# Patient Record
Sex: Female | Born: 1955 | Race: White | Hispanic: No | Marital: Married | Smoking: Never smoker
Health system: Southern US, Community
[De-identification: ages and names within clinical notes are randomized; demographics above are authoritative.]

## PROBLEM LIST (undated history)

## (undated) DIAGNOSIS — Z789 Other specified health status: Secondary | ICD-10-CM

## (undated) DIAGNOSIS — I219 Acute myocardial infarction, unspecified: Secondary | ICD-10-CM

## (undated) DIAGNOSIS — Z0000 Encounter for general adult medical examination without abnormal findings: Secondary | ICD-10-CM

## (undated) DIAGNOSIS — M25569 Pain in unspecified knee: Secondary | ICD-10-CM

## (undated) DIAGNOSIS — J309 Allergic rhinitis, unspecified: Secondary | ICD-10-CM

## (undated) DIAGNOSIS — I10 Essential (primary) hypertension: Secondary | ICD-10-CM

## (undated) DIAGNOSIS — E785 Hyperlipidemia, unspecified: Secondary | ICD-10-CM

## (undated) DIAGNOSIS — M79672 Pain in left foot: Secondary | ICD-10-CM

## (undated) DIAGNOSIS — N6029 Fibroadenosis of unspecified breast: Secondary | ICD-10-CM

## (undated) DIAGNOSIS — D492 Neoplasm of unspecified behavior of bone, soft tissue, and skin: Secondary | ICD-10-CM

## (undated) DIAGNOSIS — T7840XA Allergy, unspecified, initial encounter: Secondary | ICD-10-CM

## (undated) DIAGNOSIS — I259 Chronic ischemic heart disease, unspecified: Secondary | ICD-10-CM

## (undated) DIAGNOSIS — I639 Cerebral infarction, unspecified: Secondary | ICD-10-CM

## (undated) HISTORY — DX: Encounter for general adult medical examination without abnormal findings: Z00.00

## (undated) HISTORY — DX: Neoplasm of unspecified behavior of bone, soft tissue, and skin: D49.2

## (undated) HISTORY — DX: Acute myocardial infarction, unspecified: I21.9

## (undated) HISTORY — DX: Hyperlipidemia, unspecified: E78.5

## (undated) HISTORY — PX: OTHER SURGICAL HISTORY: SHX169

## (undated) HISTORY — DX: Other specified health status: Z78.9

## (undated) HISTORY — DX: Allergy, unspecified, initial encounter: T78.40XA

## (undated) HISTORY — DX: Pain in unspecified knee: M25.569

## (undated) HISTORY — DX: Chronic ischemic heart disease, unspecified: I25.9

## (undated) HISTORY — DX: Allergic rhinitis, unspecified: J30.9

## (undated) HISTORY — DX: Pain in left foot: M79.672

## (undated) HISTORY — DX: Cerebral infarction, unspecified: I63.9

## (undated) HISTORY — DX: Essential (primary) hypertension: I10

## (undated) HISTORY — DX: Fibroadenosis of unspecified breast: N60.29

---

## 2008-09-07 HISTORY — PX: CARDIAC CATHETERIZATION: SHX172

## 2011-02-09 ENCOUNTER — Encounter (INDEPENDENT_AMBULATORY_CARE_PROVIDER_SITE_OTHER): Payer: Self-pay | Admitting: Internal Medicine

## 2011-02-09 ENCOUNTER — Ambulatory Visit (INDEPENDENT_AMBULATORY_CARE_PROVIDER_SITE_OTHER): Payer: Exclusive Provider Organization | Admitting: Internal Medicine

## 2011-02-09 VITALS — BP 122/79 | HR 81 | Temp 98.3°F | Ht 64.0 in | Wt 160.0 lb

## 2011-02-09 DIAGNOSIS — Z0000 Encounter for general adult medical examination without abnormal findings: Secondary | ICD-10-CM | POA: Insufficient documentation

## 2011-02-09 DIAGNOSIS — M79672 Pain in left foot: Secondary | ICD-10-CM | POA: Insufficient documentation

## 2011-02-09 DIAGNOSIS — I219 Acute myocardial infarction, unspecified: Secondary | ICD-10-CM | POA: Insufficient documentation

## 2011-02-09 DIAGNOSIS — E785 Hyperlipidemia, unspecified: Secondary | ICD-10-CM | POA: Insufficient documentation

## 2011-02-09 DIAGNOSIS — T7840XA Allergy, unspecified, initial encounter: Secondary | ICD-10-CM | POA: Insufficient documentation

## 2011-02-09 DIAGNOSIS — I10 Essential (primary) hypertension: Secondary | ICD-10-CM | POA: Insufficient documentation

## 2011-02-09 DIAGNOSIS — M79609 Pain in unspecified limb: Secondary | ICD-10-CM

## 2011-02-09 NOTE — Progress Notes (Signed)
Subjective:       Patient ID: Cynthia Reyes is a 55 y.o. female.    HPI Comments: 55 year old female in to establish care and for b/l achilles pain.  She has increased her exercise and has increased her walking.  She wears tennis shoes and has increased her exercise.  She denies travel a 12 hour flight .  She denies swelling and pain in calves.  She has a hx of MI in 2010.  Pain has been ongoing now for months.  She was working with a Psychologist, educational- she had used shoes.  She states the pain is every day and constant .  She denies it is not affected by heals.           Review of Systems   Constitutional: Negative.    HENT: Negative.    Respiratory: Negative.    Cardiovascular: Negative.    Gastrointestinal: Negative.    Genitourinary: Negative.            Objective:    Physical Exam   Nursing note and vitals reviewed.  HENT:   Head: Normocephalic and atraumatic.   Right Ear: External ear normal.   Left Ear: External ear normal.   Eyes: EOM are normal.   Neck: Normal range of motion. Neck supple.   Cardiovascular: Normal rate, regular rhythm and normal heart sounds.    Pulmonary/Chest: Effort normal and breath sounds normal.   Abdominal: Soft. Bowel sounds are normal.   Musculoskeletal: Normal range of motion.   Skin: Skin is warm and dry.           Assessment and Plan:               Marland Kitchen Hyperlipidemia      On medication   . Allergy      seasonal on Meds   . Hypertension      On Meds   . Myocardial infarction stress test in July 2012     05/23/2009- on meds Cardiologist Dr. Luberta Mutter    . Health maintenance examination      Pap/Mammogram:  July 2011   Labs done: one year ago.  tdap: ? 10 years ago   . Pain in both feet      back of calves- achilles tendon.  NO recent travel refer to podiatry for pain.

## 2011-02-18 ENCOUNTER — Encounter (INDEPENDENT_AMBULATORY_CARE_PROVIDER_SITE_OTHER): Payer: Self-pay | Admitting: Internal Medicine

## 2011-02-18 DIAGNOSIS — N6029 Fibroadenosis of unspecified breast: Secondary | ICD-10-CM | POA: Insufficient documentation

## 2011-02-18 DIAGNOSIS — I259 Chronic ischemic heart disease, unspecified: Secondary | ICD-10-CM | POA: Insufficient documentation

## 2011-02-18 DIAGNOSIS — I639 Cerebral infarction, unspecified: Secondary | ICD-10-CM | POA: Insufficient documentation

## 2011-02-18 DIAGNOSIS — J309 Allergic rhinitis, unspecified: Secondary | ICD-10-CM | POA: Insufficient documentation

## 2011-02-18 DIAGNOSIS — D492 Neoplasm of unspecified behavior of bone, soft tissue, and skin: Secondary | ICD-10-CM | POA: Insufficient documentation

## 2011-02-19 ENCOUNTER — Encounter (INDEPENDENT_AMBULATORY_CARE_PROVIDER_SITE_OTHER): Payer: Self-pay

## 2011-03-05 ENCOUNTER — Encounter (INDEPENDENT_AMBULATORY_CARE_PROVIDER_SITE_OTHER): Payer: Self-pay | Admitting: Internal Medicine

## 2011-03-05 DIAGNOSIS — Z789 Other specified health status: Secondary | ICD-10-CM | POA: Insufficient documentation

## 2012-01-14 ENCOUNTER — Telehealth (INDEPENDENT_AMBULATORY_CARE_PROVIDER_SITE_OTHER): Payer: Self-pay | Admitting: Internal Medicine

## 2012-01-14 NOTE — Telephone Encounter (Signed)
Patient would like refill of flonase generic written to be picked up, she has appt 5/29 and would like a supply to get her until that date, please call for pick up [phone]

## 2012-01-15 ENCOUNTER — Other Ambulatory Visit (INDEPENDENT_AMBULATORY_CARE_PROVIDER_SITE_OTHER): Payer: Self-pay | Admitting: Internal Medicine

## 2012-01-15 ENCOUNTER — Telehealth (INDEPENDENT_AMBULATORY_CARE_PROVIDER_SITE_OTHER): Payer: Self-pay

## 2012-01-15 ENCOUNTER — Encounter (INDEPENDENT_AMBULATORY_CARE_PROVIDER_SITE_OTHER): Payer: Self-pay

## 2012-01-15 DIAGNOSIS — J309 Allergic rhinitis, unspecified: Secondary | ICD-10-CM

## 2012-01-15 MED ORDER — FLUTICASONE PROPIONATE 50 MCG/ACT NA SUSP
2.00 | Freq: Every day | NASAL | Status: DC
Start: 2012-01-15 — End: 2012-02-03

## 2012-01-15 NOTE — Telephone Encounter (Signed)
I have never seen her for allergies. I can give her a one month supply I will put Rx in now. Thanks

## 2012-01-15 NOTE — Progress Notes (Signed)
Called [facility] for flonase #[id number]

## 2012-02-03 ENCOUNTER — Ambulatory Visit (INDEPENDENT_AMBULATORY_CARE_PROVIDER_SITE_OTHER): Payer: Exclusive Provider Organization | Admitting: Internal Medicine

## 2012-02-03 ENCOUNTER — Encounter (INDEPENDENT_AMBULATORY_CARE_PROVIDER_SITE_OTHER): Payer: Self-pay | Admitting: Internal Medicine

## 2012-02-03 VITALS — BP 125/85 | HR 82 | Temp 98.3°F | Ht 64.0 in | Wt 167.0 lb

## 2012-02-03 DIAGNOSIS — Z0000 Encounter for general adult medical examination without abnormal findings: Secondary | ICD-10-CM

## 2012-02-03 DIAGNOSIS — Z23 Encounter for immunization: Secondary | ICD-10-CM

## 2012-02-03 DIAGNOSIS — Q829 Congenital malformation of skin, unspecified: Secondary | ICD-10-CM

## 2012-02-03 DIAGNOSIS — Q849 Congenital malformation of integument, unspecified: Secondary | ICD-10-CM

## 2012-02-03 DIAGNOSIS — J309 Allergic rhinitis, unspecified: Secondary | ICD-10-CM

## 2012-02-03 MED ORDER — FLUTICASONE PROPIONATE 50 MCG/ACT NA SUSP
2.00 | Freq: Every day | NASAL | Status: DC
Start: 2012-02-03 — End: 2013-03-02

## 2012-02-03 NOTE — Progress Notes (Signed)
Subjective:       Patient ID: Cynthia Reyes is a 56 y.o. female.    HPI    56 year old in for cpe  Review of Systems   Constitutional: Negative for diaphoresis, fatigue and unexpected weight change.   HENT: Negative for hearing loss, ear pain, congestion, sore throat, rhinorrhea, sneezing, trouble swallowing, dental problem and sinus pressure.    Eyes: Negative for pain and visual disturbance.   Respiratory: Negative for apnea, cough, chest tightness and shortness of breath.    Cardiovascular: Negative for chest pain, palpitations and leg swelling.   Gastrointestinal: Negative for nausea, vomiting, abdominal pain, diarrhea, constipation and blood in stool.   Genitourinary: Negative for dysuria, urgency, frequency, hematuria, decreased urine volume, vaginal discharge, difficulty urinating, vaginal pain, pelvic pain and dyspareunia.   Musculoskeletal: Negative for myalgias and arthralgias.   Skin: Negative for color change and rash.   Neurological: Negative for dizziness, tremors, weakness and headaches.   Hematological: Negative for adenopathy.   Psychiatric/Behavioral: Negative for suicidal ideas, disturbed wake/sleep cycle, self-injury and agitation.           Objective:    Physical Exam   Nursing note and vitals reviewed.  Constitutional: She is oriented to person, place, and time. She appears well-developed and well-nourished.   HENT:   Head: Normocephalic and atraumatic.   Right Ear: External ear normal.   Left Ear: External ear normal.   Nose: Nose normal.   Mouth/Throat: Oropharynx is clear and moist.   Eyes: EOM are normal. Right eye exhibits no discharge. Left eye exhibits no discharge.   Neck: Normal range of motion. Neck supple. No JVD present. No thyromegaly present.   Cardiovascular: Normal rate, regular rhythm, normal heart sounds and intact distal pulses.    No murmur heard.  Pulmonary/Chest: Effort normal and breath sounds normal.   Abdominal: Soft. Bowel sounds are normal. She exhibits no  distension and no mass. There is no rebound and no guarding.   Musculoskeletal: Normal range of motion. She exhibits no edema and no tenderness.   Lymphadenopathy:     She has no cervical adenopathy.   Neurological: She is oriented to person, place, and time. She has normal reflexes.   Skin: Skin is warm and dry.   Psychiatric: She has a normal mood and affect. Her behavior is normal.           Assessment:       56 year old female in for cpe   Counseling/Anticipatory Guidance:  nutrition, family planning/contraception, physical activity, healthy weight, injury prevention, misuse of tobacco, alcohol and drugs, sexual behavior and STDs, dental health, mental health, immunizations, screenings    Breast cancer and self breast exams

## 2012-02-04 LAB — COMPREHENSIVE METABOLIC PANEL
ALT: 17 U/L (ref 6–40)
AST (SGOT): 18 U/L (ref 10–35)
Albumin/Globulin Ratio: 1.6 (ref 1.0–2.5)
Albumin: 4.4 G/DL (ref 3.6–5.1)
Alkaline Phosphatase: 62 U/L (ref 33–130)
BUN: 18 MG/DL (ref 7–25)
Bilirubin, Total: 0.4 MG/DL (ref 0.2–1.2)
CO2: 26 mmol/L (ref 19–30)
Calcium: 9.9 MG/DL (ref 8.6–10.4)
Chloride: 104 mmol/L (ref 98–110)
Creatinine: 0.77 mg/dL (ref 0.50–1.05)
EGFR African American: 100 mL/min/{1.73_m2} (ref >=60)
EGFR: 86 mL/min/{1.73_m2} (ref >=60)
Globulin: 2.7 G/DL (ref 1.9–3.7)
Glucose: 92 MG/DL (ref 65–99)
Potassium: 4.4 mmol/L (ref 3.5–5.3)
Protein, Total: 7.1 G/DL (ref 6.1–8.1)
Sodium: 142 mmol/L (ref 135–146)

## 2012-02-04 LAB — CBC AND DIFFERENTIAL
Atypical Lymphocytes %: 0 %
Baso(Absolute): 16 cells/uL (ref 0–200)
Basophils: 0.3 %
Eosinophils Absolute: 47 cells/uL (ref 15–500)
Eosinophils: 0.9 %
Hematocrit: 42.3 % (ref 35.0–45.0)
Hemoglobin: 14 g/dL (ref 11.7–15.5)
Lymphocytes Absolute: 1690 cells/uL (ref 850–3900)
Lymphocytes: 32.5 %
MCH: 32.7 pg (ref 27–33)
MCHC: 33 g/dL (ref 32–36)
MCV: 99 fL (ref 80–100)
MPV: 8.6 fL (ref 7.5–11.5)
Monocytes Absolute: 322 cells/uL (ref 200–950)
Monocytes: 6.2 %
Neutrophils Absolute: 3125 cells/uL (ref 1500–7800)
Neutrophils: 60.1 %
Platelets: 274 10*3/uL (ref 140–400)
RBC: 4.27 10*6/uL (ref 3.80–5.10)
RDW: 14.9 % (ref 11.0–15.0)
WBC: 5.2 10*3/uL (ref 3.8–10.8)

## 2012-02-04 LAB — URINALYSIS
Bilirubin, UA: NEGATIVE
Blood, UA: NEGATIVE
Glucose Qualitative: NEGATIVE
Ketones UA: NEGATIVE
Leukocyte Esterase, UA: NEGATIVE
NITRITE: NEGATIVE
Protein, UA: NEGATIVE
Specific Gravity, UA: 1.024 (ref 1.001–1.035)
pH: 6 (ref 5.0–8.0)

## 2012-02-04 LAB — LIPID PANEL
Cholesterol / HDL Ratio: 2.8 (ref 0.0–5.0)
Cholesterol: 211 MG/DL — ABNORMAL HIGH (ref 125–200)
HDL: 75 MG/DL (ref >=46)
LDL Calculated: 93 MG/DL (ref <130)
Non HDL Cholesterol (LDL and VLDL): 136 mg/dL
Triglycerides: 215 MG/DL — ABNORMAL HIGH (ref <150)

## 2012-02-04 LAB — TSH: TSH: 1.32 mIU/L (ref 0.40–4.50)

## 2012-04-13 ENCOUNTER — Other Ambulatory Visit (INDEPENDENT_AMBULATORY_CARE_PROVIDER_SITE_OTHER): Payer: Self-pay | Admitting: Internal Medicine

## 2012-04-13 DIAGNOSIS — E785 Hyperlipidemia, unspecified: Secondary | ICD-10-CM

## 2012-04-19 ENCOUNTER — Ambulatory Visit (INDEPENDENT_AMBULATORY_CARE_PROVIDER_SITE_OTHER): Payer: Exclusive Provider Organization

## 2012-04-20 LAB — LIPID PANEL
Cholesterol / HDL Ratio: 2.5 (ref 0.0–5.0)
Cholesterol: 130 MG/DL (ref 125–200)
HDL: 53 MG/DL (ref >=46)
LDL Calculated: 59 MG/DL (ref <130)
Non HDL Cholesterol (LDL and VLDL): 77 mg/dL
Triglycerides: 88 MG/DL (ref <150)

## 2012-07-06 ENCOUNTER — Telehealth (INDEPENDENT_AMBULATORY_CARE_PROVIDER_SITE_OTHER): Payer: Self-pay | Admitting: Internal Medicine

## 2012-07-06 NOTE — Telephone Encounter (Signed)
[facility] is trying to process a claim for this patient's visit on 02/16/12. The general exam CPT is not enough. They need the CPT code for each test. I can fax this information to them at [phone] [facility]. Thanks!

## 2012-07-06 NOTE — Telephone Encounter (Signed)
The only CPT code would be v70.0 for annual physical. That is the only code I have and have used.

## 2012-08-11 ENCOUNTER — Other Ambulatory Visit (INDEPENDENT_AMBULATORY_CARE_PROVIDER_SITE_OTHER): Payer: Self-pay | Admitting: Internal Medicine

## 2012-08-11 NOTE — Telephone Encounter (Signed)
Pt needs a 90 day supply of lisinopril 5 mg for [facility].  Pt phone number [phone]        Thank you,      Shemel

## 2012-08-12 MED ORDER — LISINOPRIL 5 MG PO TABS
5.00 mg | ORAL_TABLET | Freq: Every day | ORAL | Status: DC
Start: 2012-08-11 — End: 2012-11-15

## 2012-08-12 NOTE — Telephone Encounter (Signed)
PT needs an apt

## 2012-10-24 ENCOUNTER — Ambulatory Visit (INDEPENDENT_AMBULATORY_CARE_PROVIDER_SITE_OTHER): Payer: Exclusive Provider Organization | Admitting: Internal Medicine

## 2012-10-24 ENCOUNTER — Encounter (INDEPENDENT_AMBULATORY_CARE_PROVIDER_SITE_OTHER): Payer: Self-pay | Admitting: Internal Medicine

## 2012-10-24 ENCOUNTER — Other Ambulatory Visit (INDEPENDENT_AMBULATORY_CARE_PROVIDER_SITE_OTHER): Payer: Self-pay | Admitting: Internal Medicine

## 2012-10-24 VITALS — BP 132/82 | HR 84 | Temp 98.6°F | Resp 15 | Ht 64.0 in | Wt 168.0 lb

## 2012-10-24 NOTE — Progress Notes (Signed)
Subjective:       Patient ID: Cynthia Reyes is a 57 y.o. female.    HPI Comments: She states for a week she is having random pains. She has a history of ovarian cyst. She also feels like she is in the middle of a period but is not. She is going for OBGYN on Thursday. She denies chest pain, jaw pain, n/v. She states the pain comes and goes. She states that sleeping makes it better. She states her husband says she cannot smell very well and cant hear    Abdominal Pain  This is a new problem. The current episode started more than 1 month ago. The onset quality is undetermined. The problem occurs intermittently. The pain is mild. The abdominal pain radiates to the left flank and LLQ. Pertinent negatives include no anorexia, arthralgias, belching, constipation, diarrhea, fever, frequency, headaches, hematochezia, melena, nausea or vomiting.       The following portions of the patient's history were reviewed and updated as appropriate: allergies, current medications, past family history, past medical history, past social history, past surgical history and problem list.    Review of Systems   Constitutional: Negative for fever.   Respiratory: Negative.    Cardiovascular: Negative.    Gastrointestinal: Positive for abdominal pain. Negative for nausea, vomiting, diarrhea, constipation, melena, hematochezia and anorexia.   Genitourinary: Negative for frequency.   Musculoskeletal: Negative for arthralgias.   Neurological: Negative for headaches.           Objective:    Physical Exam   Nursing note and vitals reviewed.  Constitutional: She appears well-developed and well-nourished.   Cardiovascular: Normal rate and regular rhythm.    Pulmonary/Chest: Effort normal and breath sounds normal.   Abdominal: Soft. Bowel sounds are normal.   Skin: Skin is warm and dry.           Assessment and Plan:        57 year old for abdominal and pelvic pain. She has decreased hearing and smell as well.   1. Decreased smell and hearing-refer  to ENT  2. Abdomial and pelivic pain.- check labs and [place]

## 2012-10-25 ENCOUNTER — Encounter (INDEPENDENT_AMBULATORY_CARE_PROVIDER_SITE_OTHER): Payer: Self-pay | Admitting: Internal Medicine

## 2012-10-25 ENCOUNTER — Telehealth (INDEPENDENT_AMBULATORY_CARE_PROVIDER_SITE_OTHER): Payer: Self-pay | Admitting: Internal Medicine

## 2012-10-25 LAB — URINALYSIS
Bilirubin, UA: NEGATIVE
Blood, UA: NEGATIVE
Glucose Qualitative: NEGATIVE
Ketones UA: NEGATIVE
NITRITE: NEGATIVE
Protein, UA: NEGATIVE
Specific Gravity, UA: 1.023 (ref 1.001–1.035)
pH: 6.5 (ref 5.0–8.0)

## 2012-10-25 LAB — URINE MICROSCOPIC
Hyaline Casts, UA: NONE SEEN
RBC UA: NONE SEEN (ref 0–3)
Urine Bacteria: NONE SEEN

## 2012-10-25 LAB — CBC
Hematocrit: 40.3 % (ref 35.0–45.0)
Hemoglobin: 13.7 g/dL (ref 11.7–15.5)
MCH: 32.3 pg (ref 27–33)
MCHC: 33.9 g/dL (ref 32–36)
MCV: 95 fL (ref 80–100)
MPV: 9.9 fL (ref 7.5–11.5)
Platelets: 260 10*3/uL (ref 140–400)
RBC: 4.24 10*6/uL (ref 3.80–5.10)
RDW: 13.3 % (ref 11.0–15.0)
WBC: 5 10*3/uL (ref 3.8–10.8)

## 2012-10-25 LAB — BASIC METABOLIC PANEL
BUN: 20 MG/DL (ref 7–25)
CO2: 27 mmol/L (ref 19–30)
Calcium: 10.3 MG/DL (ref 8.6–10.4)
Chloride: 104 mmol/L (ref 98–110)
Creatinine: 0.84 mg/dL (ref 0.50–1.05)
EGFR African American: 90 mL/min/{1.73_m2} (ref >=60)
EGFR: 78 mL/min/{1.73_m2} (ref >=60)
Glucose: 106 MG/DL — ABNORMAL HIGH (ref 65–99)
Potassium: 4.3 mmol/L (ref 3.5–5.3)
Sodium: 142 mmol/L (ref 135–146)

## 2012-10-25 NOTE — Telephone Encounter (Signed)
Pt returned a call from a nurse to give results. I informed Pt of results and she had no further questions at this time.

## 2012-10-25 NOTE — Progress Notes (Signed)
LM for pt to call regarding labs.     Please inform pt labs normal. Her Blood sugar slight high bec not fasting.

## 2012-10-26 ENCOUNTER — Ambulatory Visit: Payer: Exclusive Provider Organization

## 2012-10-26 ENCOUNTER — Ambulatory Visit: Payer: Exclusive Provider Organization | Attending: Internal Medicine

## 2012-10-26 DIAGNOSIS — N838 Other noninflammatory disorders of ovary, fallopian tube and broad ligament: Secondary | ICD-10-CM | POA: Insufficient documentation

## 2012-10-26 DIAGNOSIS — K824 Cholesterolosis of gallbladder: Secondary | ICD-10-CM | POA: Insufficient documentation

## 2012-10-26 DIAGNOSIS — K828 Other specified diseases of gallbladder: Secondary | ICD-10-CM | POA: Insufficient documentation

## 2012-10-26 DIAGNOSIS — R109 Unspecified abdominal pain: Secondary | ICD-10-CM | POA: Insufficient documentation

## 2012-11-15 ENCOUNTER — Other Ambulatory Visit (INDEPENDENT_AMBULATORY_CARE_PROVIDER_SITE_OTHER): Payer: Self-pay | Admitting: Internal Medicine

## 2012-12-30 ENCOUNTER — Telehealth (INDEPENDENT_AMBULATORY_CARE_PROVIDER_SITE_OTHER): Payer: Self-pay

## 2012-12-30 NOTE — Telephone Encounter (Signed)
Calling now.  Thanks!

## 2013-01-20 ENCOUNTER — Encounter (INDEPENDENT_AMBULATORY_CARE_PROVIDER_SITE_OTHER): Payer: Self-pay

## 2013-02-08 ENCOUNTER — Telehealth (INDEPENDENT_AMBULATORY_CARE_PROVIDER_SITE_OTHER): Payer: Self-pay | Admitting: Internal Medicine

## 2013-02-08 ENCOUNTER — Ambulatory Visit (INDEPENDENT_AMBULATORY_CARE_PROVIDER_SITE_OTHER): Payer: Exclusive Provider Organization | Admitting: Internal Medicine

## 2013-02-08 ENCOUNTER — Encounter (INDEPENDENT_AMBULATORY_CARE_PROVIDER_SITE_OTHER): Payer: Self-pay | Admitting: Internal Medicine

## 2013-02-08 ENCOUNTER — Telehealth (INDEPENDENT_AMBULATORY_CARE_PROVIDER_SITE_OTHER): Payer: Self-pay

## 2013-02-08 VITALS — BP 117/76 | HR 84 | Temp 98.1°F | Ht 64.0 in | Wt 169.0 lb

## 2013-02-08 MED ORDER — LEVOFLOXACIN 750 MG PO TABS
750.0000 mg | ORAL_TABLET | Freq: Every day | ORAL | Status: DC
Start: 2013-02-08 — End: 2013-02-28

## 2013-02-08 MED ORDER — PREDNISONE 20 MG PO TABS
ORAL_TABLET | ORAL | Status: DC
Start: 2013-02-08 — End: 2013-02-28

## 2013-02-08 NOTE — Telephone Encounter (Signed)
Patient LVM stated was told to call back around 4pm, she wants to let you know that "its not worse and not better, but not worse". Thanks

## 2013-02-08 NOTE — Progress Notes (Signed)
Subjective:       Patient ID: Cynthia Reyes is a 57 y.o. female.    HPI Comments: For 3 weeks feels pain and hurts to chew and feels sinus. She is using neti pot.     Sinusitis  This is a new problem. The current episode started 1 to 4 weeks ago. The problem has been gradually worsening since onset. There has been no fever. Associated symptoms include congestion and sinus pressure. Pertinent negatives include no chills, coughing, diaphoresis, ear pain, headaches, hoarse voice, neck pain, shortness of breath, sneezing, sore throat or swollen glands.       The following portions of the patient's history were reviewed and updated as appropriate: allergies, current medications, past family history, past medical history, past social history, past surgical history and problem list.    Review of Systems   Constitutional: Negative for chills and diaphoresis.   HENT: Positive for congestion and sinus pressure. Negative for ear pain, sore throat, hoarse voice, sneezing and neck pain.    Respiratory: Negative for cough and shortness of breath.    Neurological: Negative for headaches.           Objective:    Physical Exam   Nursing note and vitals reviewed.  Constitutional: She appears well-developed and well-nourished.   Cardiovascular: Normal rate and regular rhythm.    Pulmonary/Chest: Effort normal and breath sounds normal.   Abdominal: Soft. Bowel sounds are normal.   Skin: Skin is warm and dry.           Assessment and Plan;          57 year old in for sinus infection  rx pred pack  rx levaquin bec pcn allergy  Advised to call if not better.

## 2013-02-28 ENCOUNTER — Ambulatory Visit (INDEPENDENT_AMBULATORY_CARE_PROVIDER_SITE_OTHER): Payer: PRIVATE HEALTH INSURANCE | Admitting: Internal Medicine

## 2013-02-28 ENCOUNTER — Encounter (INDEPENDENT_AMBULATORY_CARE_PROVIDER_SITE_OTHER): Payer: Self-pay | Admitting: Internal Medicine

## 2013-02-28 VITALS — BP 129/79 | HR 80 | Temp 97.4°F | Ht 64.0 in | Wt 166.1 lb

## 2013-02-28 NOTE — Progress Notes (Addendum)
Subjective:       Patient ID: Cynthia Reyes is a 57 y.o. female.    Knee Pain   The incident occurred 12 to 24 hours ago. The incident occurred at home. There was no injury mechanism. The pain is present in the left knee. The quality of the pain is described as aching. Pertinent negatives include no inability to bear weight, loss of motion, loss of sensation, muscle weakness, numbness or tingling.       The following portions of the patient's history were reviewed and updated as appropriate: allergies, current medications, past family history, past medical history, past social history, past surgical history and problem list.    Review of Systems   Constitutional: Negative.    Respiratory: Negative.    Cardiovascular: Negative.    Musculoskeletal: Positive for arthralgias.   Skin: Positive for color change.   Neurological: Negative for tingling and numbness.           Objective:    Physical Exam   Nursing note and vitals reviewed.  Constitutional: She appears well-developed and well-nourished.   Cardiovascular: Normal rate and regular rhythm.    Pulmonary/Chest: Effort normal and breath sounds normal.   Abdominal: Soft. Bowel sounds are normal.   Musculoskeletal: Normal range of motion. She exhibits tenderness. She exhibits no edema.        Achilles tenderness and pain on int rotation with left knee. Hurts going down stairs with left knee . Pain on minimal touch and movment achilles tendon   Skin: Skin is warm and dry.           Assessment:       57 year old in for left knee pain heard a pop this morning and left achilles pain for 2 years- has seen Ortho  Check MRI  Refer to ORtho after MRI

## 2013-03-02 ENCOUNTER — Other Ambulatory Visit (INDEPENDENT_AMBULATORY_CARE_PROVIDER_SITE_OTHER): Payer: Self-pay | Admitting: Internal Medicine

## 2013-03-07 ENCOUNTER — Ambulatory Visit: Payer: Exclusive Provider Organization

## 2013-04-14 ENCOUNTER — Ambulatory Visit
Admission: RE | Admit: 2013-04-14 | Discharge: 2013-04-14 | Disposition: A | Discharge status: Home / Self Care | Payer: PRIVATE HEALTH INSURANCE | Source: Ambulatory Visit | Attending: Internal Medicine | Admitting: Internal Medicine

## 2013-04-14 DIAGNOSIS — M7120 Synovial cyst of popliteal space [Baker], unspecified knee: Secondary | ICD-10-CM | POA: Insufficient documentation

## 2013-04-14 DIAGNOSIS — IMO0002 Reserved for concepts with insufficient information to code with codable children: Secondary | ICD-10-CM | POA: Insufficient documentation

## 2013-04-14 DIAGNOSIS — M25469 Effusion, unspecified knee: Secondary | ICD-10-CM | POA: Insufficient documentation

## 2013-04-14 DIAGNOSIS — M6740 Ganglion, unspecified site: Secondary | ICD-10-CM | POA: Insufficient documentation

## 2013-04-14 MED ORDER — GADOBUTROL 1 MMOL/ML IV SOLN
10.00 mL | Freq: Once | INTRAVENOUS | Status: AC | PRN
Start: 2013-04-14 — End: 2013-04-14
  Administered 2013-04-14: 7 mL via INTRAVENOUS

## 2013-04-18 ENCOUNTER — Telehealth (INDEPENDENT_AMBULATORY_CARE_PROVIDER_SITE_OTHER): Payer: Self-pay

## 2013-04-18 NOTE — Telephone Encounter (Signed)
I told Cynthia Reyes to call her did she not do it? [name] this is an issue if nurses do not tell patients the information I tell them

## 2013-04-19 NOTE — Telephone Encounter (Signed)
Left message on patient's voicemail asking her to call the office back for her MRI results. Also mentioned in the message that patient needs to see ortho.

## 2013-05-23 ENCOUNTER — Other Ambulatory Visit: Payer: Self-pay | Admitting: Orthopaedic Surgery

## 2013-05-30 ENCOUNTER — Other Ambulatory Visit: Payer: Self-pay | Admitting: Orthopaedic Surgery

## 2013-05-30 ENCOUNTER — Ambulatory Visit: Payer: PRIVATE HEALTH INSURANCE | Attending: Orthopaedic Surgery

## 2013-05-30 DIAGNOSIS — M659 Unspecified synovitis and tenosynovitis, unspecified site: Secondary | ICD-10-CM | POA: Insufficient documentation

## 2013-05-30 DIAGNOSIS — M7750 Other enthesopathy of unspecified foot: Secondary | ICD-10-CM | POA: Insufficient documentation

## 2013-05-30 DIAGNOSIS — M7660 Achilles tendinitis, unspecified leg: Secondary | ICD-10-CM | POA: Insufficient documentation

## 2013-07-12 ENCOUNTER — Encounter (INDEPENDENT_AMBULATORY_CARE_PROVIDER_SITE_OTHER): Payer: Self-pay

## 2013-07-13 ENCOUNTER — Other Ambulatory Visit (INDEPENDENT_AMBULATORY_CARE_PROVIDER_SITE_OTHER): Payer: Self-pay | Admitting: Internal Medicine

## 2013-07-13 ENCOUNTER — Ambulatory Visit (INDEPENDENT_AMBULATORY_CARE_PROVIDER_SITE_OTHER): Payer: PRIVATE HEALTH INSURANCE

## 2013-07-27 ENCOUNTER — Ambulatory Visit (INDEPENDENT_AMBULATORY_CARE_PROVIDER_SITE_OTHER): Payer: PRIVATE HEALTH INSURANCE

## 2013-07-28 LAB — COMPREHENSIVE METABOLIC PANEL
ALT: 30 U/L — ABNORMAL HIGH (ref 6–29)
AST (SGOT): 27 U/L (ref 10–35)
Albumin/Globulin Ratio: 1.9 (ref 1.0–2.5)
Albumin: 4.4 G/DL (ref 3.6–5.1)
Alkaline Phosphatase: 48 U/L (ref 33–130)
BUN / Creatinine Ratio: 37.3 — ABNORMAL HIGH (ref 6–22)
BUN: 28 MG/DL — ABNORMAL HIGH (ref 7–25)
Bilirubin, Total: 0.4 MG/DL (ref 0.2–1.2)
CO2: 28 mmol/L (ref 19–30)
Calcium: 9.7 MG/DL (ref 8.6–10.4)
Chloride: 105 mmol/L (ref 98–110)
Creatinine: 0.75 mg/dL (ref 0.50–1.05)
EGFR African American: 103 mL/min/{1.73_m2} (ref >=60)
EGFR: 88 mL/min/{1.73_m2} (ref >=60)
Globulin: 2.3 G/DL (ref 1.9–3.7)
Glucose: 91 MG/DL (ref 65–99)
Potassium: 4.4 mmol/L (ref 3.5–5.3)
Protein, Total: 6.7 G/DL (ref 6.1–8.1)
Sodium: 141 mmol/L (ref 135–146)

## 2013-07-28 LAB — LIPID PANEL
Cholesterol / HDL Ratio: 2 (ref 0.0–5.0)
Cholesterol: 120 MG/DL — ABNORMAL LOW (ref 125–200)
HDL: 61 mg/dL (ref >=46)
LDL Calculated: 44 mg/dL (ref <130)
Non HDL Cholesterol (LDL and VLDL): 59 mg/dL
Triglycerides: 73 MG/DL (ref <150)

## 2013-07-28 LAB — CK: Creatinine Kinase, Total: 86 U/L (ref 29–143)

## 2013-12-26 ENCOUNTER — Other Ambulatory Visit (INDEPENDENT_AMBULATORY_CARE_PROVIDER_SITE_OTHER): Payer: Self-pay | Admitting: Internal Medicine

## 2013-12-26 DIAGNOSIS — J309 Allergic rhinitis, unspecified: Secondary | ICD-10-CM

## 2013-12-26 MED ORDER — FLUTICASONE PROPIONATE 50 MCG/ACT NA SUSP
2.0000 | Freq: Every day | NASAL | Status: AC
Start: 2013-12-26 — End: ?

## 2014-03-08 ENCOUNTER — Ambulatory Visit (INDEPENDENT_AMBULATORY_CARE_PROVIDER_SITE_OTHER): Payer: PRIVATE HEALTH INSURANCE | Admitting: Internal Medicine

## 2014-03-08 ENCOUNTER — Encounter (INDEPENDENT_AMBULATORY_CARE_PROVIDER_SITE_OTHER): Payer: Self-pay | Admitting: Internal Medicine

## 2014-03-08 VITALS — BP 98/69 | HR 85 | Temp 98.5°F | Ht 64.0 in | Wt 140.0 lb

## 2014-03-08 DIAGNOSIS — R439 Unspecified disturbances of smell and taste: Secondary | ICD-10-CM

## 2014-03-08 DIAGNOSIS — J3089 Other allergic rhinitis: Secondary | ICD-10-CM

## 2014-03-08 DIAGNOSIS — Z0000 Encounter for general adult medical examination without abnormal findings: Secondary | ICD-10-CM

## 2014-03-08 DIAGNOSIS — R438 Other disturbances of smell and taste: Secondary | ICD-10-CM

## 2014-03-08 MED ORDER — MONTELUKAST SODIUM 10 MG PO TABS
10.0000 mg | ORAL_TABLET | Freq: Every day | ORAL | Status: AC
Start: 2014-03-08 — End: 2015-03-08

## 2014-03-08 MED ORDER — PREDNISONE 20 MG PO TABS
ORAL_TABLET | ORAL | Status: DC
Start: 2014-03-08 — End: 2014-09-20

## 2014-03-08 NOTE — Progress Notes (Signed)
Subjective:       Patient ID: Cynthia Reyes is a 58 y.o. female.    Sinusitis  This is a chronic problem. The current episode started more than 1 year ago. Associated symptoms include congestion and sinus pressure. Pertinent negatives include no chills, coughing, neck pain, shortness of breath or sore throat.       The following portions of the patient's history were reviewed and updated as appropriate: allergies, current medications, past family history, past medical history, past social history, past surgical history and problem list.    Review of Systems   Constitutional: Negative for chills.   HENT: Positive for congestion and sinus pressure. Negative for sore throat.    Respiratory: Negative for cough and shortness of breath.    Musculoskeletal: Negative for neck pain.           Objective:    Physical Exam   Constitutional: She appears well-developed and well-nourished.   Cardiovascular: Normal rate and regular rhythm.    Pulmonary/Chest: Effort normal and breath sounds normal.   Abdominal: Soft. Bowel sounds are normal. She exhibits no distension. There is no tenderness.   Skin: Skin is warm and dry.   Nursing note and vitals reviewed.          Assessment:     Plan:      Procedures        1. Routine general medical examination at a health care facility  Mammo Digital Screening Bilateral W Cad    Comprehensive metabolic panel    TSH    CBC without differential    Urinalysis   2. Decreased sense of smell  Ambulatory referral to ENT    montelukast (SINGULAIR) 10 MG tablet    predniSONE (DELTASONE) 20 MG tablet   3. Other allergic rhinitis  montelukast (SINGULAIR) 10 MG tablet    predniSONE (DELTASONE) 20 MG tablet

## 2014-03-09 ENCOUNTER — Other Ambulatory Visit (INDEPENDENT_AMBULATORY_CARE_PROVIDER_SITE_OTHER): Payer: Self-pay | Admitting: Internal Medicine

## 2014-03-09 DIAGNOSIS — Z0000 Encounter for general adult medical examination without abnormal findings: Secondary | ICD-10-CM

## 2014-03-15 ENCOUNTER — Encounter (INDEPENDENT_AMBULATORY_CARE_PROVIDER_SITE_OTHER): Payer: Self-pay | Admitting: Internal Medicine

## 2014-03-22 ENCOUNTER — Other Ambulatory Visit (INDEPENDENT_AMBULATORY_CARE_PROVIDER_SITE_OTHER): Payer: Self-pay | Admitting: Internal Medicine

## 2014-03-22 ENCOUNTER — Telehealth (INDEPENDENT_AMBULATORY_CARE_PROVIDER_SITE_OTHER): Payer: Self-pay | Admitting: Internal Medicine

## 2014-03-22 NOTE — Telephone Encounter (Signed)
Since we did not do a breast exam she should get it from her [facility]. Please let me know if issues

## 2014-03-22 NOTE — Telephone Encounter (Signed)
Per central scheduling order given to pt did not have a correct dx code please enter order on EPIC for a diagonistic screening not routine mammo

## 2014-03-23 ENCOUNTER — Ambulatory Visit: Payer: PRIVATE HEALTH INSURANCE | Attending: Internal Medicine

## 2014-03-23 DIAGNOSIS — Z0000 Encounter for general adult medical examination without abnormal findings: Secondary | ICD-10-CM | POA: Insufficient documentation

## 2014-04-10 ENCOUNTER — Telehealth (INDEPENDENT_AMBULATORY_CARE_PROVIDER_SITE_OTHER): Payer: Self-pay | Admitting: Internal Medicine

## 2014-04-10 NOTE — Telephone Encounter (Signed)
Attempt pre-visit call, No answer.  LM with appointment reminder.

## 2014-04-11 ENCOUNTER — Ambulatory Visit (INDEPENDENT_AMBULATORY_CARE_PROVIDER_SITE_OTHER): Payer: PRIVATE HEALTH INSURANCE

## 2014-04-11 ENCOUNTER — Ambulatory Visit (INDEPENDENT_AMBULATORY_CARE_PROVIDER_SITE_OTHER): Payer: PRIVATE HEALTH INSURANCE | Admitting: Internal Medicine

## 2014-04-11 ENCOUNTER — Encounter (INDEPENDENT_AMBULATORY_CARE_PROVIDER_SITE_OTHER): Payer: Self-pay | Admitting: Internal Medicine

## 2014-04-11 ENCOUNTER — Other Ambulatory Visit (FREE_STANDING_LABORATORY_FACILITY): Payer: PRIVATE HEALTH INSURANCE

## 2014-04-11 VITALS — BP 105/68 | HR 85 | Temp 97.5°F | Ht 64.0 in | Wt 144.0 lb

## 2014-04-11 DIAGNOSIS — E785 Hyperlipidemia, unspecified: Secondary | ICD-10-CM

## 2014-04-11 DIAGNOSIS — R928 Other abnormal and inconclusive findings on diagnostic imaging of breast: Secondary | ICD-10-CM

## 2014-04-11 DIAGNOSIS — Z0000 Encounter for general adult medical examination without abnormal findings: Secondary | ICD-10-CM

## 2014-04-11 LAB — COMPREHENSIVE METABOLIC PANEL
ALT: 17 U/L (ref 0–55)
AST (SGOT): 16 U/L (ref 5–34)
Albumin/Globulin Ratio: 1.2 (ref 0.9–2.2)
Albumin: 3.7 g/dL (ref 3.5–5.0)
Alkaline Phosphatase: 58 U/L (ref 37–106)
BUN: 18 mg/dL (ref 7.0–19.0)
Bilirubin, Total: 0.4 mg/dL (ref 0.1–1.2)
CO2: 28 mEq/L (ref 21–30)
Calcium: 10.1 mg/dL (ref 8.5–10.5)
Chloride: 107 mEq/L (ref 100–111)
Creatinine: 0.8 mg/dL (ref 0.4–1.5)
Globulin: 3 g/dL (ref 2.0–3.7)
Glucose: 98 mg/dL (ref 70–100)
Potassium: 4.8 mEq/L (ref 3.5–5.3)
Protein, Total: 6.7 g/dL (ref 6.0–8.3)
Sodium: 143 mEq/L (ref 135–146)

## 2014-04-11 LAB — URINE MICROSCOPIC

## 2014-04-11 LAB — CBC
Hematocrit: 44.9 % (ref 37.0–47.0)
Hgb: 14.3 g/dL (ref 12.0–16.0)
MCH: 32.1 pg — ABNORMAL HIGH (ref 28.0–32.0)
MCHC: 31.8 g/dL — ABNORMAL LOW (ref 32.0–36.0)
MCV: 100.7 fL — ABNORMAL HIGH (ref 80.0–100.0)
MPV: 11.1 fL (ref 9.4–12.3)
Nucleated RBC: 0 /100 WBC (ref 0–1)
Platelets: 266 10*3/uL (ref 140–400)
RBC: 4.46 10*6/uL (ref 4.20–5.40)
RDW: 14 % (ref 12–15)
WBC: 4.71 10*3/uL (ref 3.50–10.80)

## 2014-04-11 LAB — HEMOLYSIS INDEX: Hemolysis Index: 4 (ref 0–18)

## 2014-04-11 LAB — URINALYSIS
Bilirubin, UA: NEGATIVE
Blood, UA: NEGATIVE
Glucose, UA: NEGATIVE
Ketones UA: NEGATIVE
Nitrite, UA: NEGATIVE
Protein, UR: NEGATIVE
Specific Gravity UA: 1.021 (ref 1.001–1.035)
Urine pH: 6.5 (ref 5.0–8.0)
Urobilinogen, UA: 0.2 (ref 0.2–2.0)

## 2014-04-11 LAB — LIPID PANEL
Cholesterol / HDL Ratio: 3
Cholesterol: 222 mg/dL — ABNORMAL HIGH (ref 0–199)
HDL: 75 mg/dL (ref >40)
LDL Calculated: 129 mg/dL — ABNORMAL HIGH (ref 0–99)
Triglycerides: 89 mg/dL (ref 34–149)
VLDL Calculated: 18 mg/dL (ref 10–40)

## 2014-04-11 LAB — TSH: TSH: 1.41 u[IU]/mL (ref 0.35–4.94)

## 2014-04-11 LAB — GFR: EGFR: >60

## 2014-04-11 LAB — CK: Creatine Kinase (CK): 64 U/L (ref 29–168)

## 2014-04-11 NOTE — Progress Notes (Signed)
Subjective:       Patient ID: Cynthia Reyes is a 58 y.o. female.    HPI Comments: 58 year old in for cpe      The following portions of the patient's history were reviewed and updated as appropriate: allergies, current medications, past family history, past medical history, past social history, past surgical history and problem list.    Review of Systems   Constitutional: Negative for diaphoresis, fatigue and unexpected weight change.   HENT: Negative for congestion, dental problem, ear pain, hearing loss, rhinorrhea, sinus pressure, sneezing, sore throat and trouble swallowing.    Eyes: Negative for pain and visual disturbance.   Respiratory: Negative for apnea, cough, chest tightness and shortness of breath.    Cardiovascular: Negative for chest pain, palpitations and leg swelling.   Gastrointestinal: Negative for nausea, vomiting, abdominal pain, diarrhea, constipation and blood in stool.   Genitourinary: Negative for dysuria, urgency, frequency, hematuria, decreased urine volume, vaginal discharge, difficulty urinating, vaginal pain, pelvic pain and dyspareunia.   Musculoskeletal: Negative for myalgias and arthralgias.   Skin: Negative for color change and rash.   Neurological: Negative for dizziness, tremors, weakness and headaches.   Hematological: Negative for adenopathy.   Psychiatric/Behavioral: Negative for suicidal ideas, sleep disturbance, self-injury and agitation.           Objective:    Physical Exam   Constitutional: She is oriented to person, place, and time. She appears well-developed and well-nourished.   HENT:   Head: Normocephalic and atraumatic.   Right Ear: External ear normal.   Left Ear: External ear normal.   Nose: Nose normal.   Mouth/Throat: Oropharynx is clear and moist.   Eyes: EOM are normal. Right eye exhibits no discharge. Left eye exhibits no discharge.   Neck: Normal range of motion. Neck supple. No JVD present. No thyromegaly present.   Cardiovascular: Normal rate, regular  rhythm, normal heart sounds and intact distal pulses.    No murmur heard.  Pulmonary/Chest: Effort normal and breath sounds normal.   Abdominal: Soft. Bowel sounds are normal. She exhibits no distension and no mass. There is no rebound and no guarding.   Musculoskeletal: Normal range of motion. She exhibits no edema or tenderness.   Lymphadenopathy:     She has no cervical adenopathy.   Neurological: She is oriented to person, place, and time. She has normal reflexes.   Skin: Skin is warm and dry.   Psychiatric: She has a normal mood and affect. Her behavior is normal.   Nursing note and vitals reviewed.          Assessment:             Plan:      Procedures    1. Other and unspecified hyperlipidemia  Lipid panel    Comprehensive metabolic panel    CBC without differential    TSH    Urinalysis    CK   2. Routine general medical examination at a health care facility  Lipid panel    Comprehensive metabolic panel    CBC without differential    TSH    Urinalysis    CK   3. Abnormal mammogram              Has OBGYN  UTD on COLO  Dr Fransisco Beau Cardiologist    CHECK [place] FOR RIGHT BREAST    Counseling/Anticipatory Guidance:  nutrition, family planning/contraception, physical activity, healthy weight, injury prevention, misuse of tobacco, alcohol and drugs, sexual behavior  and STDs, dental health, mental health, immunizations, screenings   Breast cancer and self breast exams

## 2014-04-16 ENCOUNTER — Encounter (INDEPENDENT_AMBULATORY_CARE_PROVIDER_SITE_OTHER): Payer: Self-pay

## 2014-04-16 NOTE — Progress Notes (Signed)
Spoke to pt about lab results, aware to work on diet and exercise and recheck in 6-12 months

## 2014-04-23 ENCOUNTER — Encounter (INDEPENDENT_AMBULATORY_CARE_PROVIDER_SITE_OTHER): Payer: Self-pay | Admitting: Internal Medicine

## 2014-04-23 ENCOUNTER — Other Ambulatory Visit (FREE_STANDING_LABORATORY_FACILITY): Payer: PRIVATE HEALTH INSURANCE

## 2014-04-23 ENCOUNTER — Telehealth (INDEPENDENT_AMBULATORY_CARE_PROVIDER_SITE_OTHER): Payer: Self-pay

## 2014-04-23 ENCOUNTER — Ambulatory Visit (INDEPENDENT_AMBULATORY_CARE_PROVIDER_SITE_OTHER): Payer: PRIVATE HEALTH INSURANCE | Admitting: Internal Medicine

## 2014-04-23 VITALS — BP 121/75 | HR 83 | Temp 98.3°F | Ht 64.0 in | Wt 146.0 lb

## 2014-04-23 DIAGNOSIS — J029 Acute pharyngitis, unspecified: Secondary | ICD-10-CM

## 2014-04-23 LAB — POCT RAPID STREP A: Rapid Strep A Screen POCT: NEGATIVE

## 2014-04-23 MED ORDER — AZITHROMYCIN 250 MG PO TABS
ORAL_TABLET | ORAL | Status: AC
Start: 2014-04-23 — End: 2014-04-28

## 2014-04-23 NOTE — Progress Notes (Signed)
Subjective:       Patient ID: Cynthia Reyes is a 58 y.o. female.    Sore Throat   This is a new problem. The current episode started in the past 7 days. The problem has been gradually worsening. Pertinent negatives include no abdominal pain, congestion, coughing, diarrhea, ear discharge, ear pain, headaches, plugged ear sensation, shortness of breath, swollen glands or vomiting.       The following portions of the patient's history were reviewed and updated as appropriate: allergies, current medications, past family history, past medical history, past social history, past surgical history and problem list.    Review of Systems   HENT: Negative for congestion, ear discharge and ear pain.    Respiratory: Negative for cough and shortness of breath.    Gastrointestinal: Negative for vomiting, abdominal pain and diarrhea.   Neurological: Negative for headaches.           Objective:    Physical Exam   Constitutional: She appears well-developed and well-nourished.   Cardiovascular: Normal rate, regular rhythm and normal heart sounds.    Pulmonary/Chest: Effort normal and breath sounds normal.   Abdominal: Soft. Bowel sounds are normal.   Skin: Skin is warm and dry.   Nursing note and vitals reviewed.          Assessment:             Plan:      Procedures            1. Sore throat  azithromycin (ZITHROMAX Z-PAK) 250 MG tablet

## 2014-04-23 NOTE — Addendum Note (Signed)
Addended by: Shea Evans on: 04/23/2014 03:32 PM     Modules accepted: Orders

## 2014-04-23 NOTE — Telephone Encounter (Signed)
Calling now

## 2014-04-26 ENCOUNTER — Other Ambulatory Visit: Payer: Self-pay | Admitting: Otolaryngic Allergy

## 2014-04-26 DIAGNOSIS — R439 Unspecified disturbances of smell and taste: Secondary | ICD-10-CM

## 2014-04-26 DIAGNOSIS — J329 Chronic sinusitis, unspecified: Secondary | ICD-10-CM

## 2014-05-01 ENCOUNTER — Ambulatory Visit
Admission: RE | Admit: 2014-05-01 | Discharge: 2014-05-01 | Disposition: A | Discharge status: Home / Self Care | Payer: PRIVATE HEALTH INSURANCE | Source: Ambulatory Visit | Attending: Otolaryngic Allergy | Admitting: Otolaryngic Allergy

## 2014-05-01 DIAGNOSIS — R439 Unspecified disturbances of smell and taste: Secondary | ICD-10-CM | POA: Insufficient documentation

## 2014-05-01 DIAGNOSIS — J329 Chronic sinusitis, unspecified: Secondary | ICD-10-CM | POA: Insufficient documentation

## 2014-06-19 ENCOUNTER — Ambulatory Visit (INDEPENDENT_AMBULATORY_CARE_PROVIDER_SITE_OTHER): Payer: PRIVATE HEALTH INSURANCE

## 2014-06-19 ENCOUNTER — Other Ambulatory Visit (INDEPENDENT_AMBULATORY_CARE_PROVIDER_SITE_OTHER): Payer: Self-pay | Admitting: Internal Medicine

## 2014-06-19 DIAGNOSIS — Z23 Encounter for immunization: Secondary | ICD-10-CM

## 2014-06-19 NOTE — Progress Notes (Signed)
Patient presented to the office for flu vaccine administration.  Received injection in the Left arm.  No reaction was noted and patient left in good condition. Lot [id number] Exp:03/07/15

## 2014-09-20 ENCOUNTER — Encounter (INDEPENDENT_AMBULATORY_CARE_PROVIDER_SITE_OTHER): Payer: Commercial Managed Care - POS

## 2014-09-20 ENCOUNTER — Ambulatory Visit (INDEPENDENT_AMBULATORY_CARE_PROVIDER_SITE_OTHER): Payer: No Typology Code available for payment source | Admitting: Internal Medicine

## 2014-09-20 ENCOUNTER — Encounter (INDEPENDENT_AMBULATORY_CARE_PROVIDER_SITE_OTHER): Payer: Self-pay | Admitting: Internal Medicine

## 2014-09-20 VITALS — BP 125/79 | HR 72 | Temp 98.1°F | Ht 64.0 in | Wt 148.0 lb

## 2014-09-20 DIAGNOSIS — R059 Cough, unspecified: Secondary | ICD-10-CM

## 2014-09-20 DIAGNOSIS — R438 Other disturbances of smell and taste: Secondary | ICD-10-CM

## 2014-09-20 DIAGNOSIS — R05 Cough: Secondary | ICD-10-CM

## 2014-09-20 DIAGNOSIS — J3089 Other allergic rhinitis: Secondary | ICD-10-CM

## 2014-09-20 MED ORDER — PREDNISONE 20 MG PO TABS
ORAL_TABLET | ORAL | Status: DC
Start: 2014-09-20 — End: 2016-03-06

## 2014-09-20 MED ORDER — AZITHROMYCIN 250 MG PO TABS
ORAL_TABLET | ORAL | Status: AC
Start: 2014-09-20 — End: 2014-09-25

## 2014-09-20 NOTE — Progress Notes (Signed)
Subjective:       Patient ID: Cynthia Reyes is a 59 y.o. female.    Cough  This is a new problem. The current episode started 1 to 4 weeks ago. The problem has been gradually worsening. Pertinent negatives include no chest pain, chills, fever, heartburn, hemoptysis, myalgias, nasal congestion, shortness of breath, weight loss or wheezing.       The following portions of the patient's history were reviewed and updated as appropriate: allergies, current medications, past family history, past medical history, past social history, past surgical history and problem list.    Review of Systems   Constitutional: Negative for fever, chills and weight loss.   Respiratory: Positive for cough. Negative for hemoptysis, shortness of breath and wheezing.    Cardiovascular: Negative for chest pain.   Gastrointestinal: Negative for heartburn.   Musculoskeletal: Negative for myalgias.           Objective:    Physical Exam   Constitutional: She appears well-developed and well-nourished.   Cardiovascular: Normal rate, regular rhythm and normal heart sounds.    Pulmonary/Chest: Effort normal and breath sounds normal.   Abdominal: Soft. Bowel sounds are normal.   Skin: Skin is warm and dry.   Nursing note and vitals reviewed.          Assessment:             Plan:      Procedures        59 year old in for URI   rx prednisone  rx zpack

## 2014-09-21 ENCOUNTER — Encounter (INDEPENDENT_AMBULATORY_CARE_PROVIDER_SITE_OTHER): Payer: Self-pay | Admitting: Internal Medicine

## 2014-09-21 ENCOUNTER — Other Ambulatory Visit: Payer: Self-pay | Admitting: Cardiovascular Disease

## 2014-09-21 DIAGNOSIS — I2510 Atherosclerotic heart disease of native coronary artery without angina pectoris: Secondary | ICD-10-CM

## 2014-09-27 ENCOUNTER — Ambulatory Visit
Admission: RE | Admit: 2014-09-27 | Discharge: 2014-09-27 | Disposition: A | Discharge status: Home / Self Care | Payer: No Typology Code available for payment source | Source: Ambulatory Visit | Attending: Cardiovascular Disease | Admitting: Cardiovascular Disease

## 2014-10-15 ENCOUNTER — Telehealth (INDEPENDENT_AMBULATORY_CARE_PROVIDER_SITE_OTHER): Payer: Self-pay | Admitting: Internal Medicine

## 2014-10-15 NOTE — Telephone Encounter (Signed)
The ordering MD  would need to order it as I was not the ordering MD

## 2014-10-15 NOTE — Telephone Encounter (Signed)
Pt called and stated that she is having a coronary CT scan this Thursday on 10/18/2014  And need  A lab order for creatinine.

## 2014-10-18 ENCOUNTER — Ambulatory Visit
Admission: RE | Admit: 2014-10-18 | Discharge: 2014-10-18 | Disposition: A | Discharge status: Home / Self Care | Payer: No Typology Code available for payment source | Source: Ambulatory Visit | Attending: Cardiovascular Disease | Admitting: Cardiovascular Disease

## 2014-10-30 ENCOUNTER — Ambulatory Visit
Admission: RE | Admit: 2014-10-30 | Discharge: 2014-10-30 | Disposition: A | Discharge status: Home / Self Care | Payer: No Typology Code available for payment source | Source: Ambulatory Visit | Attending: Cardiovascular Disease | Admitting: Cardiovascular Disease

## 2014-10-30 DIAGNOSIS — Q245 Malformation of coronary vessels: Secondary | ICD-10-CM | POA: Insufficient documentation

## 2014-10-30 DIAGNOSIS — I2510 Atherosclerotic heart disease of native coronary artery without angina pectoris: Secondary | ICD-10-CM

## 2014-10-30 MED ORDER — IODIXANOL 320 MG/ML IV SOLN
135.0000 mL | Freq: Once | INTRAVENOUS | Status: AC | PRN
Start: 2014-10-30 — End: 2014-10-30
  Administered 2014-10-30: 135 mL via INTRAVENOUS

## 2014-10-30 NOTE — Progress Notes (Signed)
Pt tolerated procedure well.  Vitals signs stable and back to baseline. Pt denies pain, sob and nausea. Pt advised to hydrate today to flush contrast for renal protection.  Patient ambulated to [facility] lobby with nurse at her side to meet spouse for transport home with belongings in tow.

## 2014-11-15 ENCOUNTER — Ambulatory Visit (INDEPENDENT_AMBULATORY_CARE_PROVIDER_SITE_OTHER): Payer: No Typology Code available for payment source | Admitting: Internal Medicine

## 2014-11-15 ENCOUNTER — Encounter (INDEPENDENT_AMBULATORY_CARE_PROVIDER_SITE_OTHER): Payer: Self-pay | Admitting: Internal Medicine

## 2014-11-15 VITALS — BP 125/80 | HR 80 | Temp 98.2°F | Ht 64.0 in | Wt 150.0 lb

## 2014-11-15 DIAGNOSIS — F5101 Primary insomnia: Secondary | ICD-10-CM

## 2014-11-15 MED ORDER — ZOLPIDEM TARTRATE 10 MG PO TABS
10.0000 mg | ORAL_TABLET | Freq: Every evening | ORAL | Status: DC | PRN
Start: 2014-11-15 — End: 2016-04-09

## 2014-11-15 NOTE — Progress Notes (Signed)
Subjective:       Patient ID: Cynthia Reyes is a 59 y.o. female.    HPI Comments: CC: insomnia    HPI: 59 year old in for insomnia. Her mother passed away.  She is having problems sleeping.       The following portions of the patient's history were reviewed and updated as appropriate: allergies, current medications, past family history, past medical history, past social history, past surgical history and problem list.    Review of Systems   Constitutional: Negative for diaphoresis, fatigue and unexpected weight change.   HENT: Negative for congestion, dental problem, ear pain, hearing loss, rhinorrhea, sinus pressure, sneezing, sore throat and trouble swallowing.    Eyes: Negative for pain and visual disturbance.   Respiratory: Negative for apnea, cough, chest tightness and shortness of breath.    Cardiovascular: Negative for chest pain, palpitations and leg swelling.   Gastrointestinal: Negative for nausea, vomiting, abdominal pain, diarrhea, constipation and blood in stool.   Genitourinary: Negative for dysuria, urgency, frequency, hematuria, decreased urine volume, vaginal discharge, difficulty urinating, vaginal pain, pelvic pain and dyspareunia.   Musculoskeletal: Negative for myalgias and arthralgias.   Skin: Negative for color change and rash.   Neurological: Negative for dizziness, tremors, weakness and headaches.   Hematological: Negative for adenopathy.   Psychiatric/Behavioral: Negative for suicidal ideas, sleep disturbance, self-injury and agitation.           Objective:    Physical Exam   Constitutional: She is oriented to person, place, and time. She appears well-developed and well-nourished.   HENT:   Head: Normocephalic and atraumatic.   Right Ear: External ear normal.   Left Ear: External ear normal.   Nose: Nose normal.   Mouth/Throat: Oropharynx is clear and moist.   Eyes: EOM are normal. Right eye exhibits no discharge. Left eye exhibits no discharge.   Neck: Normal range of motion. Neck  supple. No JVD present. No thyromegaly present.   Cardiovascular: Normal rate, regular rhythm, normal heart sounds and intact distal pulses.    No murmur heard.  Pulmonary/Chest: Effort normal and breath sounds normal.   Abdominal: Soft. Bowel sounds are normal. She exhibits no distension and no mass. There is no rebound and no guarding.   Musculoskeletal: Normal range of motion. She exhibits no edema or tenderness.   Lymphadenopathy:     She has no cervical adenopathy.   Neurological: She is oriented to person, place, and time. She has normal reflexes.   Skin: Skin is warm and dry.   Psychiatric: She has a normal mood and affect. Her behavior is normal.   Nursing note and vitals reviewed.          Assessment:             Plan:      Procedures    1. Primary insomnia  zolpidem (AMBIEN) 10 MG tablet

## 2015-01-04 ENCOUNTER — Encounter (INDEPENDENT_AMBULATORY_CARE_PROVIDER_SITE_OTHER): Payer: Self-pay | Admitting: Internal Medicine

## 2015-02-11 ENCOUNTER — Encounter (INDEPENDENT_AMBULATORY_CARE_PROVIDER_SITE_OTHER): Payer: Self-pay | Admitting: Internal Medicine

## 2015-03-21 ENCOUNTER — Encounter (INDEPENDENT_AMBULATORY_CARE_PROVIDER_SITE_OTHER): Payer: Self-pay | Admitting: Internal Medicine

## 2015-04-29 ENCOUNTER — Encounter (INDEPENDENT_AMBULATORY_CARE_PROVIDER_SITE_OTHER): Payer: Self-pay | Admitting: Internal Medicine

## 2015-04-29 ENCOUNTER — Ambulatory Visit (INDEPENDENT_AMBULATORY_CARE_PROVIDER_SITE_OTHER): Payer: No Typology Code available for payment source | Admitting: Internal Medicine

## 2015-04-29 VITALS — BP 135/76 | HR 81 | Temp 97.6°F | Ht 64.0 in | Wt 161.0 lb

## 2015-04-29 DIAGNOSIS — Z23 Encounter for immunization: Secondary | ICD-10-CM

## 2015-04-29 DIAGNOSIS — Z111 Encounter for screening for respiratory tuberculosis: Secondary | ICD-10-CM

## 2015-04-29 NOTE — Progress Notes (Signed)
Subjective:       Patient ID: Cynthia Reyes is a 59 y.o. female.    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    Review of Systems   Constitutional: Negative for diaphoresis, fatigue and unexpected weight change.   HENT: Negative for congestion, dental problem, ear pain, hearing loss, rhinorrhea, sinus pressure, sneezing, sore throat and trouble swallowing.    Eyes: Negative for pain and visual disturbance.   Respiratory: Negative for apnea, cough, chest tightness and shortness of breath.    Cardiovascular: Negative for chest pain, palpitations and leg swelling.   Gastrointestinal: Negative for nausea, vomiting, abdominal pain, diarrhea, constipation and blood in stool.   Genitourinary: Negative for dysuria, urgency, frequency, hematuria, decreased urine volume, vaginal discharge, difficulty urinating, vaginal pain, pelvic pain and dyspareunia.   Musculoskeletal: Negative for myalgias and arthralgias.   Skin: Negative for color change and rash.   Neurological: Negative for dizziness, tremors, weakness and headaches.   Hematological: Negative for adenopathy.   Psychiatric/Behavioral: Negative for suicidal ideas, sleep disturbance, self-injury and agitation.           Objective:    Physical Exam   Constitutional: She is oriented to person, place, and time. She appears well-developed and well-nourished.   HENT:   Head: Normocephalic and atraumatic.   Right Ear: External ear normal.   Left Ear: External ear normal.   Nose: Nose normal.   Mouth/Throat: Oropharynx is clear and moist.   Eyes: EOM are normal. Right eye exhibits no discharge. Left eye exhibits no discharge.   Neck: Normal range of motion. Neck supple. No JVD present. No thyromegaly present.    Cardiovascular: Normal rate, regular rhythm, normal heart sounds and intact distal pulses.    No murmur heard.  Pulmonary/Chest: Effort normal and breath sounds normal.   Abdominal: Soft. Bowel sounds are normal. She exhibits no distension and no mass. There is no rebound and no guarding.   Musculoskeletal: Normal range of motion. She exhibits no edema or tenderness.   Lymphadenopathy:     She has no cervical adenopathy.   Neurological: She is oriented to person, place, and time. She has normal reflexes.   Skin: Skin is warm and dry.   Psychiatric: She has a normal mood and affect. Her behavior is normal.   Nursing note and vitals reviewed.          Assessment:             Plan:      Procedures        1. Encounter for PPD skin test reading  TB Skin Test    TB Skin Test    CANCELED: TB Skin Test   2. Need for shingles vaccine  Varicella-zoster vaccine subcutaneous

## 2015-04-29 NOTE — Progress Notes (Signed)
Have you sought any care outside of the [facility]?  No

## 2015-05-01 ENCOUNTER — Ambulatory Visit (INDEPENDENT_AMBULATORY_CARE_PROVIDER_SITE_OTHER): Payer: No Typology Code available for payment source

## 2015-05-01 LAB — TB SKIN TEST
Induration: 0
TB Skin Test: NEGATIVE mm

## 2015-10-25 ENCOUNTER — Other Ambulatory Visit (FREE_STANDING_LABORATORY_FACILITY): Payer: No Typology Code available for payment source

## 2015-10-25 DIAGNOSIS — I10 Essential (primary) hypertension: Secondary | ICD-10-CM

## 2015-10-25 DIAGNOSIS — Z5181 Encounter for therapeutic drug level monitoring: Secondary | ICD-10-CM

## 2015-10-25 DIAGNOSIS — E785 Hyperlipidemia, unspecified: Secondary | ICD-10-CM

## 2015-10-25 DIAGNOSIS — R42 Dizziness and giddiness: Secondary | ICD-10-CM

## 2015-10-25 LAB — CBC AND DIFFERENTIAL
Basophils Absolute Automated: 0.04 10*3/uL (ref 0.00–0.20)
Basophils Automated: 1 %
Eosinophils Absolute Automated: 0.04 10*3/uL (ref 0.00–0.70)
Eosinophils Automated: 1 %
Hematocrit: 42.4 % (ref 37.0–47.0)
Hgb: 13.8 g/dL (ref 12.0–16.0)
Immature Granulocytes Absolute: 0.01 10*3/uL
Immature Granulocytes: 0 %
Lymphocytes Absolute Automated: 1.55 10*3/uL (ref 0.50–4.40)
Lymphocytes Automated: 37 %
MCH: 32.7 pg — ABNORMAL HIGH (ref 28.0–32.0)
MCHC: 32.5 g/dL (ref 32.0–36.0)
MCV: 100.5 fL — ABNORMAL HIGH (ref 80.0–100.0)
MPV: 11.2 fL (ref 9.4–12.3)
Monocytes Absolute Automated: 0.31 10*3/uL (ref 0.00–1.20)
Monocytes: 7 %
Neutrophils Absolute: 2.25 10*3/uL (ref 1.80–8.10)
Neutrophils: 54 %
Nucleated RBC: 0 /100 WBC (ref 0–1)
Platelets: 325 10*3/uL (ref 140–400)
RBC: 4.22 10*6/uL (ref 4.20–5.40)
RDW: 14 % (ref 12–15)
WBC: 4.2 10*3/uL (ref 3.50–10.80)

## 2015-10-25 LAB — TSH: TSH: 1.47 u[IU]/mL (ref 0.35–4.94)

## 2015-10-25 LAB — COMPREHENSIVE METABOLIC PANEL
ALT: 24 U/L (ref 0–55)
AST (SGOT): 20 U/L (ref 5–34)
Albumin/Globulin Ratio: 1.1 (ref 0.9–2.2)
Albumin: 3.7 g/dL (ref 3.5–5.0)
Alkaline Phosphatase: 75 U/L (ref 37–106)
BUN: 22 mg/dL — ABNORMAL HIGH (ref 7.0–19.0)
Bilirubin, Total: 0.5 mg/dL (ref 0.1–1.2)
CO2: 29 mEq/L (ref 21–30)
Calcium: 9.9 mg/dL (ref 8.5–10.5)
Chloride: 105 mEq/L (ref 100–111)
Creatinine: 0.8 mg/dL (ref 0.4–1.5)
Globulin: 3.5 g/dL (ref 2.0–3.7)
Glucose: 88 mg/dL (ref 70–100)
Potassium: 4.7 mEq/L (ref 3.5–5.3)
Protein, Total: 7.2 g/dL (ref 6.0–8.3)
Sodium: 141 mEq/L (ref 135–146)

## 2015-10-25 LAB — LIPID PANEL
Cholesterol / HDL Ratio: 3.1
Cholesterol: 199 mg/dL (ref 0–199)
HDL: 65 mg/dL (ref 40–9999)
LDL Calculated: 107 mg/dL — ABNORMAL HIGH (ref 0–99)
Triglycerides: 133 mg/dL (ref 34–149)
VLDL Calculated: 27 mg/dL (ref 10–40)

## 2015-10-25 LAB — GFR: EGFR: >60

## 2015-10-25 LAB — HEMOLYSIS INDEX: Hemolysis Index: 7 (ref 0–18)

## 2015-10-25 LAB — CK: Creatine Kinase (CK): 105 U/L (ref 29–168)

## 2015-10-28 ENCOUNTER — Encounter (INDEPENDENT_AMBULATORY_CARE_PROVIDER_SITE_OTHER): Payer: Self-pay | Admitting: Internal Medicine

## 2016-03-04 ENCOUNTER — Encounter (INDEPENDENT_AMBULATORY_CARE_PROVIDER_SITE_OTHER): Payer: Self-pay | Admitting: Internal Medicine

## 2016-03-06 ENCOUNTER — Encounter (INDEPENDENT_AMBULATORY_CARE_PROVIDER_SITE_OTHER): Payer: Self-pay | Admitting: Internal Medicine

## 2016-03-06 ENCOUNTER — Ambulatory Visit (INDEPENDENT_AMBULATORY_CARE_PROVIDER_SITE_OTHER): Payer: No Typology Code available for payment source | Admitting: Internal Medicine

## 2016-03-06 VITALS — BP 126/75 | HR 79 | Temp 97.9°F | Ht 64.0 in | Wt 167.0 lb

## 2016-03-06 DIAGNOSIS — J029 Acute pharyngitis, unspecified: Secondary | ICD-10-CM

## 2016-03-06 LAB — POCT RAPID STREP A: Rapid Strep A Screen POCT: NEGATIVE

## 2016-03-06 MED ORDER — AZITHROMYCIN 250 MG PO TABS
ORAL_TABLET | ORAL | Status: DC
Start: 2016-03-06 — End: 2016-04-09

## 2016-03-06 MED ORDER — PREDNISONE 20 MG PO TABS
ORAL_TABLET | ORAL | Status: DC
Start: 2016-03-06 — End: 2016-04-09

## 2016-03-06 NOTE — Progress Notes (Signed)
Subjective:      Patient ID: Cynthia Reyes is a 60 y.o. female.    Chief Complaint:  Chief Complaint   Patient presents with   . Sore Throat       HPI:  HPI Comments: She had a sore throat and she has no pressure and states she has more in her throat     Sore Throat   This is a new problem. The current episode started in the past 7 days. The problem has been gradually worsening. Associated symptoms include congestion. Pertinent negatives include no abdominal pain, coughing, drooling, ear discharge, ear pain, headaches, hoarse voice, plugged ear sensation, shortness of breath or stridor. She has had exposure to strep. She has had no exposure to mono.       Problem List:  Patient Active Problem List   Diagnosis   . Allergy   . Myocardial infarction   . Hypertension   . Hyperlipidemia   . Health maintenance examination   . Pain in both feet   . Chronic ischemic heart disease, unspecified   . Fibroadenosis of breast   . Allergic rhinitis, cause unspecified   . Neoplasm of unspecified nature of bone, soft tissue, and skin   . CVA (cerebral infarction)   . Medical history non-contributory       Current Medications:  Current Outpatient Prescriptions   Medication Sig Dispense Refill   . aspirin 81 MG tablet Take 81 mg by mouth daily.       . Cholecalciferol (VITAMIN D-3 PO) Take by mouth.     . clopidogrel (PLAVIX) 75 MG tablet Take 75 mg by mouth daily.       . fluticasone (FLONASE) 50 MCG/ACT nasal spray 2 sprays by Nasal route daily. 1 g 11   . lisinopril (PRINIVIL,ZESTRIL) 5 MG tablet TAKE 1 TABLET DAILY 90 tablet 0   . Multiple Vitamins-Minerals (PRESERVISION AREDS PO) Take by mouth.     . nitroglycerin (NITROSTAT) 0.4 MG SL tablet Place 0.4 mg under the tongue every 5 (five) minutes as needed.       . pravastatin (PRAVACHOL) 80 MG tablet Take 80 mg by mouth daily.       Marland Kitchen zolpidem (AMBIEN) 10 MG tablet Take 1 tablet (10 mg total) by mouth nightly as needed for Sleep. 30 tablet 0     No current facility-administered  medications for this visit.       Allergies:  Allergies   Allergen Reactions   . Penicillins        Past Medical History:  Past Medical History   Diagnosis Date   . Hyperlipidemia      On medication   . Allergy      seasonal on Meds   . Hypertension      On Meds   . Myocardial infarction stress test in July 2012     05/23/2009- on meds Cardiologist Dr. Luberta Mutter    . Health maintenance examination GYN 2015 DERM 2015       Derm Exam tdap 2013 COLO needs one- referral given . pap will schedule with OBGYN Mammogram 2015- need Right br [place]   . Pain in both feet      back of calves- achilles tendon.  NO recent travel   . Chronic ischemic heart disease, unspecified      Dr. Fransisco Beau Cardiologist   . Fibroadenosis of breast    . Allergic rhinitis, cause unspecified    . Neoplasm of unspecified nature of bone,  soft tissue, and skin      hx of AK reviewing [facility] record[facility] 6/12   . Myocardial infarction      2010 RCA stent had rehab   . CVA (cerebral infarction)      old records reviewed mri 2006    . Medical history non-contributory      Reviewed 03/04/11: Cath done 2010 with stent    . Knee pain      ortho - possible surgery in future       Past Surgical History:  Past Surgical History   Procedure Laterality Date   . Cardiac catheterization  2010   . Dnc  83-85       Family History:  Family History   Problem Relation Age of Onset   . Hypertension Mother    . Hyperlipidemia Mother    . Arthritis Father    . Cancer Father      lung ca. smoker   . Hypertension Father    . Hyperlipidemia Father    . Osteoporosis Father    . Osteopenia Father    . Arthritis Paternal Grandmother    . Cancer Paternal Grandmother      lung ca. smoker       Social History:  Social History     Social History   . Marital Status: Married     Spouse Name: N/A   . Number of Children: N/A   . Years of Education: N/A     Occupational History   . Not on file.     Social History Main Topics   . Smoking status: Never Smoker    . Smokeless tobacco: Not on file   . Alcohol  Use: Yes   . Drug Use: No   . Sexual Activity: Not on file     Other Topics Concern   . Not on file     Social History Narrative       The following portions of the patient's history were reviewed and updated as appropriate: allergies, current medications, past family history, past medical history, past social history, past surgical history and problem list.    ROS:  Review of Systems   HENT: Positive for congestion. Negative for drooling, ear discharge, ear pain and hoarse voice.    Respiratory: Negative for cough, shortness of breath and stridor.    Gastrointestinal: Negative for abdominal pain.   Neurological: Negative for headaches.       Vitals:  BP 126/75 mmHg  Pulse 79  Temp(Src) 97.9 F (36.6 C) (Oral)  Ht 1.626 m (5\' 4" )  Wt 75.751 kg (167 lb)  BMI 28.65 kg/m2  SpO2 99%    Objective:     Physical Exam:  Physical Exam   Constitutional: She is oriented to person, place, and time. She appears well-developed and well-nourished.   HENT:   Head: Normocephalic and atraumatic.   Right Ear: External ear normal.   Left Ear: External ear normal.   Nose: Nose normal.   Mouth/Throat: Oropharynx is clear and moist.   Eyes: EOM are normal. Right eye exhibits no discharge. Left eye exhibits no discharge.   Neck: Normal range of motion. Neck supple. No JVD present. No thyromegaly present.   Cardiovascular: Normal rate, regular rhythm, normal heart sounds and intact distal pulses.    No murmur heard.  Pulmonary/Chest: Effort normal and breath sounds normal.   Abdominal: Soft. Bowel sounds are normal. She exhibits no distension and no mass. There is no rebound and  no guarding.   Musculoskeletal: Normal range of motion. She exhibits no edema or tenderness.   Lymphadenopathy:     She has no cervical adenopathy.   Neurological: She is oriented to person, place, and time. She has normal reflexes.   Skin: Skin is warm and dry.   Psychiatric: She has a normal mood and affect. Her behavior is normal.   Nursing note and vitals  reviewed.      Assessment:     1. Sore throat  - POCT Rapid Group A Strep    rx zpack   rx prednisone           Joseluis Alessio Angelene Giovanni, MD

## 2016-03-13 ENCOUNTER — Encounter (INDEPENDENT_AMBULATORY_CARE_PROVIDER_SITE_OTHER): Payer: Self-pay

## 2016-03-18 ENCOUNTER — Encounter (INDEPENDENT_AMBULATORY_CARE_PROVIDER_SITE_OTHER): Payer: Self-pay | Admitting: Internal Medicine

## 2016-03-19 ENCOUNTER — Telehealth (INDEPENDENT_AMBULATORY_CARE_PROVIDER_SITE_OTHER): Payer: Self-pay | Admitting: Internal Medicine

## 2016-03-19 ENCOUNTER — Encounter (INDEPENDENT_AMBULATORY_CARE_PROVIDER_SITE_OTHER): Payer: Self-pay | Admitting: Internal Medicine

## 2016-03-19 NOTE — Telephone Encounter (Signed)
Pt R/S her phy for 8/3 and would like to do the BW before apt in [place]. Could you enter the BW orders so that she can sched with [place] week before [facility]?

## 2016-03-23 ENCOUNTER — Other Ambulatory Visit (INDEPENDENT_AMBULATORY_CARE_PROVIDER_SITE_OTHER): Payer: Self-pay | Admitting: Internal Medicine

## 2016-03-23 DIAGNOSIS — Z0000 Encounter for general adult medical examination without abnormal findings: Secondary | ICD-10-CM

## 2016-03-23 NOTE — Telephone Encounter (Signed)
Yes putting in order now

## 2016-03-23 NOTE — Telephone Encounter (Signed)
Left VM with pt to help her sched.BW apt [place].

## 2016-04-03 ENCOUNTER — Ambulatory Visit (INDEPENDENT_AMBULATORY_CARE_PROVIDER_SITE_OTHER): Payer: No Typology Code available for payment source | Admitting: Obstetrics & Gynecology

## 2016-04-03 ENCOUNTER — Encounter (HOSPITAL_BASED_OUTPATIENT_CLINIC_OR_DEPARTMENT_OTHER): Payer: Self-pay | Admitting: Obstetrics & Gynecology

## 2016-04-03 VITALS — BP 111/71 | HR 74 | Ht 64.0 in | Wt 162.0 lb

## 2016-04-03 DIAGNOSIS — N952 Postmenopausal atrophic vaginitis: Secondary | ICD-10-CM

## 2016-04-03 DIAGNOSIS — Z01419 Encounter for gynecological examination (general) (routine) without abnormal findings: Secondary | ICD-10-CM

## 2016-04-03 DIAGNOSIS — Z8262 Family history of osteoporosis: Secondary | ICD-10-CM

## 2016-04-03 MED ORDER — ESTROGENS, CONJUGATED 0.625 MG/GM VA CREA
TOPICAL_CREAM | VAGINAL | Status: DC
Start: 2016-04-03 — End: 2016-10-15

## 2016-04-03 NOTE — Patient Instructions (Signed)
You had a normal well woman exam including a normal breast exam today.   Please continue to do your monthly self breast exam.  Continue to eat a diet with many fruits, vegetables and lean proteins.   It is best to aim for 30-60 minutes of exercise daily.   Please use condoms with every intercourse to prevent sexually transmitted infections.   You should follow up in 1 year for a well woman exam.   After age 60 pap testing is recommended every 5 years.   Mammogram screening is recommended starting at age 40 every year to two years depending on family and personal history.  Colon cancer screening is recommended starting at age 50 or sooner depending on personal or family history.     Patient Copy

## 2016-04-04 NOTE — Progress Notes (Signed)
S:    Cynthia Reyes is a 60 y.o. female, [phone], No LMP recorded. Patient is postmenopausal. Here today for annual visit.  Pt denies any PMB, abd pain, abnormal vaginal discharge/itching.  Pt denies any postmenopausal symptoms as well as urinary symptoms. Pt does have some discomfort from vaginal atrophy, but is not sexually active at this time due to spouse's medical situation. She would be interested in vaginal estrogen for the irritation. Also, patient has concerns regarding osteoporosis with family history. She has additional concerns with some degree of stool incontinence she believes is due to birth injury, but currently on a protein diet that is causing symptoms of IBD. We reviewed she did need to see GI, and have regular evaluation when this was diet was completed. She may also benefit from a consult with colon-rectal surgery.      @[email or web address]  1 year ago -Dr Valentino Saxon- [facility]  @LASTMAMMOGRAM @-1 year ago- generally gets called back for additional vies      GYN hx: menopausal, no stds, no h/o abn pap smears    OB hx: [id number], NSVDx 2    PMH:   Past Medical History   Diagnosis Date   . Hyperlipidemia      On medication   . Allergy      seasonal on Meds   . Hypertension      On Meds   . Myocardial infarction stress test in July 2012     05/23/2009- on meds Cardiologist Dr. Luberta Mutter    . Health maintenance examination GYN 2015 DERM 2015       Derm Exam tdap 2013 COLO needs one- referral given . pap will schedule with OBGYN Mammogram 2015- need Right br [place]   . Pain in both feet      back of calves- achilles tendon.  NO recent travel   . Chronic ischemic heart disease, unspecified      Dr. Fransisco Beau Cardiologist   . Fibroadenosis of breast    . Allergic rhinitis, cause unspecified    . Neoplasm of unspecified nature of bone, soft tissue, and skin      hx of AK reviewing [facility] 6/12   . Myocardial infarction      2010 RCA stent had rehab   . CVA (cerebral infarction)      old records reviewed mri 2006    . Medical  history non-contributory      Reviewed 03/04/11: Cath done 2010 with stent    . Knee pain      ortho - possible surgery in future             PSH:   Past Surgical History   Procedure Laterality Date   . Cardiac catheterization  2010   . Dnc  83-85         SH:   Social History     Social History   . Marital Status: Married     Spouse Name: N/A   . Number of Children: N/A   . Years of Education: N/A     Occupational History   . Not on file.     Social History Main Topics   . Smoking status: Never Smoker    . Smokeless tobacco: Not on file   . Alcohol Use: Yes   . Drug Use: No   . Sexual Activity: Not Currently     Birth Control/ Protection: None     Other Topics Concern   . Not on file  Social History Narrative         Allergies   Allergen Reactions   . Penicillins          Meds:   Current Outpatient Prescriptions   Medication Sig Dispense Refill   . aspirin 81 MG tablet Take 81 mg by mouth daily.       . Cholecalciferol (VITAMIN D-3 PO) Take by mouth.     . fluticasone (FLONASE) 50 MCG/ACT nasal spray 2 sprays by Nasal route daily. 1 g 11   . Multiple Vitamins-Minerals (PRESERVISION AREDS PO) Take by mouth.     . pravastatin (PRAVACHOL) 80 MG tablet Take 80 mg by mouth daily.       Marland Kitchen azithromycin (ZITHROMAX) 250 MG tablet Take 2 day 1 then 1 day 2-5 then stop 6 tablet 0   . clopidogrel (PLAVIX) 75 MG tablet Take 75 mg by mouth daily.       Marland Kitchen conjugated estrogens (PREMARIN) vaginal cream 1g per vagina twice weekly at night 30 g 11   . lisinopril (PRINIVIL,ZESTRIL) 5 MG tablet TAKE 1 TABLET DAILY 90 tablet 0   . nitroglycerin (NITROSTAT) 0.4 MG SL tablet Place 0.4 mg under the tongue every 5 (five) minutes as needed.       . predniSONE (DELTASONE) 20 MG tablet Take 2 day 1-2 then 1 day 3-5 then stop 10 tablet 0   . zolpidem (AMBIEN) 10 MG tablet Take 1 tablet (10 mg total) by mouth nightly as needed for Sleep. 30 tablet 0     No current facility-administered medications for this visit.         Family History    Problem Relation Age of Onset   . Hypertension Mother    . Hyperlipidemia Mother    . Arthritis Father    . Cancer Father      lung ca. smoker   . Hypertension Father    . Hyperlipidemia Father    . Osteoporosis Father    . Osteopenia Father    . Arthritis Paternal Grandmother    . Cancer Paternal Grandmother      lung ca. smoker   . Breast cancer Neg Hx    . Colon cancer Neg Hx    . Ovarian cancer Neg Hx    . Stroke Neg Hx    . Miscarriages / Stillbirths Neg Hx    . Preterm labor Neg Hx    . Eclampsia Neg Hx    . Diabetes Neg Hx            Review of Systems -  General ROS: negative for fatigue, fever/chills, weight loss  CV: no chest pain  Chest: no sob  Breast ROS: negative for breast lumps, nipple discharge  Gastrointestinal ROS: no abdominal pain, change in bowel habits-stool incontinence at times, not new  Genitourinary ROS: no dysuria, trouble voiding, or hematuria  MS: neck pain  Skin: denies rash or lesion  Psych: denies depression      O:    BP 111/71 mmHg  Pulse 74  Ht 5\' 4"  (1.626 m)  Wt 162 lb (73.483 kg)  BMI 27.79 kg/m2    General appearance - alert, well appearing, and in no distress  Breasts: breasts appear normal, symmetrical, no suspicious masses, no skin or nipple changes or axillary nodes, no nipple discharge  Abdomen - soft, nontender, nondistended, no masses  Extremities: no signs of clubbing or edema     Pelvic exam:  VULVA: atrophic appearing vulva with no  masses, tenderness or lesions, normal clitoris  VAGINA: atrophic appearing vagina with normal color and discharge, no lesions,  CERVIX: normal appearing cervix without discharge or lesions,  UTERUS: uterus is normal size, shape, consistency and nontender, anteverted  ADNEXA:  nontender and no masses.  RECTAL: normal external, no hemorrhoids  RECTOVAGINAL:  normal rectovaginal septum, no masses or perimetrial nodularity            A:  1. Atrophic vaginitis    2. Well woman exam with routine gynecological exam    3. Family history of  osteoporosis            Plan    -  Well woman exam completed, will begin vaginal estrogen  -  Self breast exam reviewed with patient, self-breast awareness encouraged  -  Weight bearing exercise, balanced diet encouraged.  -  Next mammogram to be scheduled  - Colonoscopy screening discussed  -  Follow up in 1 yr or prn years.  - All questions answered and AVS reviewed with patient    Orders Placed This Encounter   Procedures   . Mammo Digital Screening Bilateral W Cad   . ([id number]) Dexa bone density axial skeleton           Debrah Granderson A Robinowitz-Elins MD FACOG

## 2016-04-06 ENCOUNTER — Encounter (HOSPITAL_BASED_OUTPATIENT_CLINIC_OR_DEPARTMENT_OTHER): Payer: Self-pay | Admitting: Obstetrics & Gynecology

## 2016-04-06 ENCOUNTER — Encounter (INDEPENDENT_AMBULATORY_CARE_PROVIDER_SITE_OTHER): Payer: Self-pay | Admitting: Internal Medicine

## 2016-04-09 ENCOUNTER — Ambulatory Visit (INDEPENDENT_AMBULATORY_CARE_PROVIDER_SITE_OTHER): Payer: No Typology Code available for payment source | Admitting: Internal Medicine

## 2016-04-09 ENCOUNTER — Encounter (INDEPENDENT_AMBULATORY_CARE_PROVIDER_SITE_OTHER): Payer: Self-pay | Admitting: Internal Medicine

## 2016-04-09 VITALS — BP 122/79 | HR 89 | Ht 64.0 in | Wt 160.0 lb

## 2016-04-09 DIAGNOSIS — Z0000 Encounter for general adult medical examination without abnormal findings: Secondary | ICD-10-CM

## 2016-04-09 DIAGNOSIS — F5101 Primary insomnia: Secondary | ICD-10-CM

## 2016-04-09 DIAGNOSIS — Z23 Encounter for immunization: Secondary | ICD-10-CM

## 2016-04-09 MED ORDER — ZOLPIDEM TARTRATE 10 MG PO TABS
10.0000 mg | ORAL_TABLET | Freq: Every evening | ORAL | Status: DC | PRN
Start: 2016-04-09 — End: 2016-10-15

## 2016-04-09 NOTE — Progress Notes (Signed)
Subjective:      Patient ID: Cynthia Reyes is a 60 y.o. female.    Chief Complaint:  Chief Complaint   Patient presents with   . Annual Exam     not fasting       HPI:  HPI Comments: 60 year old in for cpe       Problem List:  Patient Active Problem List   Diagnosis   . Allergy   . Myocardial infarction   . Hypertension   . Hyperlipidemia   . Health maintenance examination   . Pain in both feet   . Chronic ischemic heart disease, unspecified   . Fibroadenosis of breast   . Allergic rhinitis, cause unspecified   . Neoplasm of unspecified nature of bone, soft tissue, and skin   . CVA (cerebral infarction)   . Medical history non-contributory       Current Medications:  Current Outpatient Prescriptions   Medication Sig Dispense Refill   . aspirin 81 MG tablet Take 81 mg by mouth daily.       . Cholecalciferol (VITAMIN D-3 PO) Take by mouth.     . clopidogrel (PLAVIX) 75 MG tablet Take 75 mg by mouth daily.       . fluticasone (FLONASE) 50 MCG/ACT nasal spray 2 sprays by Nasal route daily. 1 g 11   . lisinopril (PRINIVIL,ZESTRIL) 5 MG tablet TAKE 1 TABLET DAILY 90 tablet 0   . Multiple Vitamins-Minerals (PRESERVISION AREDS PO) Take by mouth.     . nitroglycerin (NITROSTAT) 0.4 MG SL tablet Place 0.4 mg under the tongue every 5 (five) minutes as needed.       . pravastatin (PRAVACHOL) 80 MG tablet Take 80 mg by mouth daily.       Marland Kitchen zolpidem (AMBIEN) 10 MG tablet Take 1 tablet (10 mg total) by mouth nightly as needed for Sleep. 30 tablet 0   . conjugated estrogens (PREMARIN) vaginal cream 1g per vagina twice weekly at night 30 g 11     No current facility-administered medications for this visit.       Allergies:  Allergies   Allergen Reactions   . Penicillins        Past Medical History:  Past Medical History   Diagnosis Date   . Hyperlipidemia      On medication   . Allergy      seasonal on Meds   . Hypertension      On Meds   . Myocardial infarction stress test in July 2012     05/23/2009- on meds Cardiologist Dr.  Luberta Mutter    . Health maintenance examination GYN 2015 DERM 2015       Derm Exam tdap 2013 COLO needs one- referral given . pap will schedule with OBGYN Mammogram 2015- need Right br [place]   . Pain in both feet      back of calves- achilles tendon.  NO recent travel   . Chronic ischemic heart disease, unspecified      Dr. Fransisco Beau Cardiologist   . Fibroadenosis of breast    . Allergic rhinitis, cause unspecified    . Neoplasm of unspecified nature of bone, soft tissue, and skin      hx of AK reviewing old recordson 6/12   . Myocardial infarction      2010 RCA stent had rehab   . CVA (cerebral infarction)      old records reviewed mri 2006    . Medical history non-contributory  Reviewed 03/04/11: Cath done 2010 with stent    . Knee pain      ortho - possible surgery in future       Past Surgical History:  Past Surgical History   Procedure Laterality Date   . Cardiac catheterization  2010   . Dnc  83-85       Family History:  Family History   Problem Relation Age of Onset   . Hypertension Mother    . Hyperlipidemia Mother    . Arthritis Father    . Cancer Father      lung ca. smoker   . Hypertension Father    . Hyperlipidemia Father    . Osteoporosis Father    . Osteopenia Father    . Arthritis Paternal Grandmother    . Cancer Paternal Grandmother      lung ca. smoker   . Breast cancer Neg Hx    . Colon cancer Neg Hx    . Ovarian cancer Neg Hx    . Stroke Neg Hx    . Miscarriages / Stillbirths Neg Hx    . Preterm labor Neg Hx    . Eclampsia Neg Hx    . Diabetes Neg Hx        Social History:  Social History     Social History   . Marital Status: Married     Spouse Name: N/A   . Number of Children: N/A   . Years of Education: N/A     Occupational History   . Not on file.     Social History Main Topics   . Smoking status: Never Smoker    . Smokeless tobacco: Not on file   . Alcohol Use: Yes   . Drug Use: No   . Sexual Activity: Not Currently     Birth Control/ Protection: None     Other Topics Concern   . Not on file     Social  History Narrative       The following portions of the patient's history were reviewed and updated as appropriate: allergies, current medications, past family history, past medical history, past social history, past surgical history and problem list.    ROS:  Review of Systems   Constitutional: Negative for diaphoresis, fatigue and unexpected weight change.   HENT: Negative for congestion, dental problem, ear pain, hearing loss, rhinorrhea, sinus pressure, sneezing, sore throat and trouble swallowing.    Eyes: Negative for pain and visual disturbance.   Respiratory: Negative for apnea, cough, chest tightness and shortness of breath.    Cardiovascular: Negative for chest pain, palpitations and leg swelling.   Gastrointestinal: Negative for nausea, vomiting, abdominal pain, diarrhea, constipation and blood in stool.   Genitourinary: Negative for dysuria, urgency, frequency, hematuria, decreased urine volume, vaginal discharge, difficulty urinating, vaginal pain, pelvic pain and dyspareunia.   Musculoskeletal: Negative for myalgias and arthralgias.   Skin: Negative for color change and rash.   Neurological: Negative for dizziness, tremors, weakness and headaches.   Hematological: Negative for adenopathy.   Psychiatric/Behavioral: Negative for suicidal ideas, sleep disturbance, self-injury and agitation.       Vitals:  BP 122/79 mmHg  Pulse 89  Ht 1.626 m (5\' 4" )  Wt 72.576 kg (160 lb)  BMI 27.45 kg/m2  SpO2 99%    Objective:     Physical Exam:  Physical Exam   Constitutional: She is oriented to person, place, and time. She appears well-developed and well-nourished.   HENT:  Head: Normocephalic and atraumatic.   Right Ear: External ear normal.   Left Ear: External ear normal.   Nose: Nose normal.   Mouth/Throat: Oropharynx is clear and moist.   Eyes: EOM are normal. Right eye exhibits no discharge. Left eye exhibits no discharge.   Neck: Normal range of motion. Neck supple. No JVD present. No thyromegaly present.    Cardiovascular: Normal rate, regular rhythm, normal heart sounds and intact distal pulses.    No murmur heard.  Pulmonary/Chest: Effort normal and breath sounds normal.   Abdominal: Soft. Bowel sounds are normal. She exhibits no distension and no mass. There is no rebound and no guarding.   Musculoskeletal: Normal range of motion. She exhibits no edema or tenderness.   Lymphadenopathy:     She has no cervical adenopathy.   Neurological: She is oriented to person, place, and time. She has normal reflexes.   Skin: Skin is warm and dry.   Psychiatric: She has a normal mood and affect. Her behavior is normal.   Nursing note and vitals reviewed.      Assessment:     1. Annual physical exam  - CBC without differential  - Comprehensive metabolic panel  - Lipid panel  - TSH  - Urinalysis  - Vitamin D,25 OH, Total      Plan:     Counseling/Anticipatory Guidance:  nutrition, family planning/contraception, physical activity, healthy weight, injury prevention, misuse of tobacco, alcohol and drugs, sexual behavior and STDs, dental health, mental health, immunizations, screenings   Breast cancer and self breast exams      Jahnessa Vanduyn Angelene Giovanni, MD

## 2016-04-10 ENCOUNTER — Encounter (HOSPITAL_BASED_OUTPATIENT_CLINIC_OR_DEPARTMENT_OTHER): Payer: Self-pay | Admitting: Obstetrics & Gynecology

## 2016-04-10 ENCOUNTER — Ambulatory Visit: Payer: No Typology Code available for payment source | Attending: Obstetrics & Gynecology

## 2016-04-10 ENCOUNTER — Ambulatory Visit: Payer: No Typology Code available for payment source

## 2016-04-10 ENCOUNTER — Other Ambulatory Visit (HOSPITAL_BASED_OUTPATIENT_CLINIC_OR_DEPARTMENT_OTHER): Payer: Self-pay | Admitting: Obstetrics & Gynecology

## 2016-04-10 DIAGNOSIS — M85852 Other specified disorders of bone density and structure, left thigh: Secondary | ICD-10-CM | POA: Insufficient documentation

## 2016-04-10 DIAGNOSIS — Z01419 Encounter for gynecological examination (general) (routine) without abnormal findings: Secondary | ICD-10-CM

## 2016-04-10 DIAGNOSIS — Z8262 Family history of osteoporosis: Secondary | ICD-10-CM | POA: Insufficient documentation

## 2016-04-10 DIAGNOSIS — Z1231 Encounter for screening mammogram for malignant neoplasm of breast: Secondary | ICD-10-CM | POA: Insufficient documentation

## 2016-04-10 DIAGNOSIS — Z13820 Encounter for screening for osteoporosis: Secondary | ICD-10-CM | POA: Insufficient documentation

## 2016-04-13 ENCOUNTER — Encounter (HOSPITAL_BASED_OUTPATIENT_CLINIC_OR_DEPARTMENT_OTHER): Payer: Self-pay | Admitting: Obstetrics & Gynecology

## 2016-10-08 ENCOUNTER — Encounter (INDEPENDENT_AMBULATORY_CARE_PROVIDER_SITE_OTHER): Payer: Self-pay | Admitting: Internal Medicine

## 2016-10-09 ENCOUNTER — Ambulatory Visit (INDEPENDENT_AMBULATORY_CARE_PROVIDER_SITE_OTHER): Payer: No Typology Code available for payment source | Admitting: Internal Medicine

## 2016-10-09 ENCOUNTER — Other Ambulatory Visit (INDEPENDENT_AMBULATORY_CARE_PROVIDER_SITE_OTHER): Payer: Self-pay | Admitting: Internal Medicine

## 2016-10-09 DIAGNOSIS — I159 Secondary hypertension, unspecified: Secondary | ICD-10-CM

## 2016-10-09 DIAGNOSIS — E7800 Pure hypercholesterolemia, unspecified: Secondary | ICD-10-CM

## 2016-10-12 ENCOUNTER — Other Ambulatory Visit (FREE_STANDING_LABORATORY_FACILITY): Payer: HMO

## 2016-10-12 DIAGNOSIS — E7800 Pure hypercholesterolemia, unspecified: Secondary | ICD-10-CM

## 2016-10-12 DIAGNOSIS — I159 Secondary hypertension, unspecified: Secondary | ICD-10-CM

## 2016-10-12 LAB — LIPID PANEL
Cholesterol / HDL Ratio: 2.5
Cholesterol: 156 mg/dL (ref 0–199)
HDL: 62 mg/dL (ref 40–9999)
LDL Calculated: 79 mg/dL (ref 0–99)
Triglycerides: 73 mg/dL (ref 34–149)
VLDL Calculated: 15 mg/dL (ref 10–40)

## 2016-10-12 LAB — CBC
Absolute NRBC: 0 10*3/uL
Hematocrit: 42.9 % (ref 37.0–47.0)
Hgb: 13.6 g/dL (ref 12.0–16.0)
MCH: 31.9 pg (ref 28.0–32.0)
MCHC: 31.7 g/dL — ABNORMAL LOW (ref 32.0–36.0)
MCV: 100.5 fL — ABNORMAL HIGH (ref 80.0–100.0)
MPV: 11.6 fL (ref 9.4–12.3)
Nucleated RBC: 0 /100 WBC (ref 0.0–1.0)
Platelets: 218 10*3/uL (ref 140–400)
RBC: 4.27 10*6/uL (ref 4.20–5.40)
RDW: 14 % (ref 12–15)
WBC: 3.7 10*3/uL (ref 3.50–10.80)

## 2016-10-12 LAB — CK: Creatine Kinase (CK): 85 U/L (ref 29–168)

## 2016-10-12 LAB — COMPREHENSIVE METABOLIC PANEL
ALT: 15 U/L (ref 0–55)
AST (SGOT): 17 U/L (ref 5–34)
Albumin/Globulin Ratio: 1.3 (ref 0.9–2.2)
Albumin: 3.7 g/dL (ref 3.5–5.0)
Alkaline Phosphatase: 61 U/L (ref 37–106)
BUN: 21 mg/dL — ABNORMAL HIGH (ref 7.0–19.0)
Bilirubin, Total: 0.4 mg/dL (ref 0.1–1.2)
CO2: 31 mEq/L — ABNORMAL HIGH (ref 21–29)
Calcium: 9.9 mg/dL (ref 8.5–10.5)
Chloride: 109 mEq/L (ref 100–111)
Creatinine: 0.7 mg/dL (ref 0.4–1.5)
Globulin: 2.9 g/dL (ref 2.0–3.7)
Glucose: 97 mg/dL (ref 70–100)
Potassium: 4.9 mEq/L (ref 3.5–5.1)
Protein, Total: 6.6 g/dL (ref 6.0–8.3)
Sodium: 145 mEq/L (ref 136–145)

## 2016-10-12 LAB — GFR: EGFR: >60

## 2016-10-12 LAB — HEMOLYSIS INDEX: Hemolysis Index: 6 (ref 0–18)

## 2016-10-15 ENCOUNTER — Ambulatory Visit (INDEPENDENT_AMBULATORY_CARE_PROVIDER_SITE_OTHER): Payer: HMO | Admitting: Internal Medicine

## 2016-10-15 ENCOUNTER — Encounter (INDEPENDENT_AMBULATORY_CARE_PROVIDER_SITE_OTHER): Payer: Self-pay | Admitting: Internal Medicine

## 2016-10-15 VITALS — BP 119/77 | HR 89 | Temp 98.3°F | Ht 64.0 in | Wt 147.0 lb

## 2016-10-15 DIAGNOSIS — E7800 Pure hypercholesterolemia, unspecified: Secondary | ICD-10-CM

## 2016-10-15 DIAGNOSIS — I10 Essential (primary) hypertension: Secondary | ICD-10-CM

## 2016-10-15 DIAGNOSIS — F5101 Primary insomnia: Secondary | ICD-10-CM

## 2016-10-15 MED ORDER — ZOLPIDEM TARTRATE 10 MG PO TABS
10.0000 mg | ORAL_TABLET | Freq: Every evening | ORAL | 0 refills | Status: AC | PRN
Start: 2016-10-15 — End: ?

## 2016-10-15 NOTE — Progress Notes (Signed)
Subjective:      Patient ID: KHADIJA THIER is a 61 y.o. female.    Chief Complaint:  Chief Complaint   Patient presents with   . Follow-up   . Hypertension   . Hyperlipidemia       HPI:  Hypertension   This is a chronic problem. The current episode started more than 1 month ago. The problem is unchanged. The problem is controlled. Pertinent negatives include no blurred vision, chest pain, headaches, malaise/fatigue, neck pain or orthopnea. There are no known risk factors for coronary artery disease. There is no history of chronic renal disease.   Hyperlipidemia   This is a chronic problem. The current episode started more than 1 year ago. The problem is controlled. She has no history of chronic renal disease, diabetes or liver disease. Pertinent negatives include no chest pain, focal sensory loss, focal weakness or leg pain.       Problem List:  Patient Active Problem List   Diagnosis   . Allergy   . Myocardial infarction   . Hypertension   . Hyperlipidemia   . Health maintenance examination   . Pain in both feet   . Chronic ischemic heart disease, unspecified   . Fibroadenosis of breast   . Allergic rhinitis, cause unspecified   . Neoplasm of unspecified nature of bone, soft tissue, and skin   . CVA (cerebral infarction)   . Medical history non-contributory       Current Medications:  Current Outpatient Prescriptions   Medication Sig Dispense Refill   . aspirin 81 MG tablet Take 81 mg by mouth daily.       . Cholecalciferol (VITAMIN D-3 PO) Take by mouth.     . clopidogrel (PLAVIX) 75 MG tablet Take 75 mg by mouth daily.       Marland Kitchen conjugated estrogens (PREMARIN) vaginal cream 1g per vagina twice weekly at night 30 g 11   . fluticasone (FLONASE) 50 MCG/ACT nasal spray 2 sprays by Nasal route daily. 1 g 11   . lisinopril (PRINIVIL,ZESTRIL) 5 MG tablet TAKE 1 TABLET DAILY 90 tablet 0   . Multiple Vitamins-Minerals (PRESERVISION AREDS PO) Take by mouth.     . nitroglycerin (NITROSTAT) 0.4 MG SL tablet Place 0.4 mg  under the tongue every 5 (five) minutes as needed.       . pravastatin (PRAVACHOL) 80 MG tablet Take 80 mg by mouth daily.       Marland Kitchen zolpidem (AMBIEN) 10 MG tablet Take 1 tablet (10 mg total) by mouth nightly as needed for Sleep. 30 tablet 0     No current facility-administered medications for this visit.        Allergies:  Allergies   Allergen Reactions   . Penicillins        Past Medical History:  Past Medical History:   Diagnosis Date   . Allergic rhinitis, cause unspecified    . Allergy     seasonal on Meds   . Chronic ischemic heart disease, unspecified     Dr. Fransisco Beau Cardiologist   . CVA (cerebral infarction)     old records reviewed mri 2006    . Fibroadenosis of breast    . Health maintenance examination GYN 2015 DERM 2015 On IP diet      Derm Exam tdap 2013 COLO needs one- referral given . pap will schedule with OBGYN Mammogram 2015- need Right br [place]   . Hyperlipidemia     On medication   .  Hypertension     On Meds   . Knee pain     ortho - possible surgery in future   . Medical history non-contributory     Reviewed 03/04/11: Cath done 2010 with stent    . Myocardial infarction stress test in July 2012    05/23/2009- on meds Cardiologist Dr. Luberta Mutter    . Myocardial infarction     2010 RCA stent had rehab   . Neoplasm of unspecified nature of bone, soft tissue, and skin     hx of AK reviewing old recordson 6/12   . Pain in both feet     back of calves- achilles tendon.  NO recent travel       Past Surgical History:  Past Surgical History:   Procedure Laterality Date   . CARDIAC CATHETERIZATION  2010   . DNC  83-85       Family History:  Family History   Problem Relation Age of Onset   . Hypertension Mother    . Hyperlipidemia Mother    . Arthritis Father    . Cancer Father      lung ca. smoker   . Hypertension Father    . Hyperlipidemia Father    . Osteoporosis Father    . Osteopenia Father    . Arthritis Paternal Grandmother    . Cancer Paternal Grandmother      lung ca. smoker   . Breast cancer Neg Hx    . Colon  cancer Neg Hx    . Ovarian cancer Neg Hx    . Stroke Neg Hx    . Miscarriages / Stillbirths Neg Hx    . Preterm labor Neg Hx    . Eclampsia Neg Hx    . Diabetes Neg Hx        Social History:  Social History     Social History   . Marital status: Married     Spouse name: N/A   . Number of children: N/A   . Years of education: N/A     Occupational History   . Not on file.     Social History Main Topics   . Smoking status: Never Smoker   . Smokeless tobacco: Never Used   . Alcohol use Yes   . Drug use: No   . Sexual activity: Not Currently     Birth control/ protection: None     Other Topics Concern   . Not on file     Social History Narrative   . No narrative on file       The following sections were reviewed this encounter by the provider:        ROS:  Review of Systems   Constitutional: Negative for malaise/fatigue.   Eyes: Negative for blurred vision.   Cardiovascular: Negative for chest pain and orthopnea.   Musculoskeletal: Negative for neck pain.   Neurological: Negative for focal weakness and headaches.       Vitals:  Ht 1.626 m (5\' 4" )      Objective:     Physical Exam:  Physical Exam   Constitutional: She appears well-developed and well-nourished.   Cardiovascular: Normal rate and regular rhythm.    Pulmonary/Chest: Effort normal and breath sounds normal.   Abdominal: Soft. Bowel sounds are normal.   Nursing note and vitals reviewed.       Assessment:     61 year old in for high blood pressure and high cholesterol    Plan:  Has seen Cardiology  On meds  Continue with meds  Tristian Bouska Angelene Giovanni, MD

## 2016-10-20 ENCOUNTER — Encounter (INDEPENDENT_AMBULATORY_CARE_PROVIDER_SITE_OTHER): Payer: Self-pay | Admitting: Internal Medicine

## 2016-10-21 ENCOUNTER — Ambulatory Visit (INDEPENDENT_AMBULATORY_CARE_PROVIDER_SITE_OTHER): Payer: HMO | Admitting: Internal Medicine

## 2016-10-21 ENCOUNTER — Encounter (INDEPENDENT_AMBULATORY_CARE_PROVIDER_SITE_OTHER): Payer: Self-pay | Admitting: Internal Medicine

## 2016-10-21 VITALS — BP 114/70 | HR 77 | Temp 98.0°F | Resp 16 | Ht 64.0 in | Wt 149.0 lb

## 2016-10-21 DIAGNOSIS — J029 Acute pharyngitis, unspecified: Secondary | ICD-10-CM

## 2016-10-21 LAB — POCT RAPID STREP A: Rapid Strep A Screen POCT: NEGATIVE

## 2016-10-21 NOTE — Progress Notes (Signed)
Nursing Documentation:  Limb alert status: Patient asked and denied any limb restrictions for blood pressure/blood draws.  Has the patient seen any other providers since their last visit: no  The patient is due for [facility] Letter.

## 2016-10-21 NOTE — Progress Notes (Signed)
Subjective:    156  Patient ID: Cynthia Reyes is a 61 y.o. female     Chief Complaint   Patient presents with   . Sore Throat   . Ear Fullness     Left        Sore Throat    This is a new problem. Episode onset: scrathcy throat 3 days ago. yesterday left ear felt wet.  going on flight tomorrows and wanted to make sure she was ok. Progression since onset: Manufacturing systems engineer. There has been no fever. The pain is at a severity of 3/10. The pain is moderate. Pertinent negatives include no headaches or shortness of breath. She has had exposure to strep. Exposure to: several kids with strep and flu at [facility].        The following sections were reviewed this encounter by the provider:        Review of Systems   Constitutional: Negative for chills and fever.   Respiratory: Negative for chest tightness and shortness of breath.    Cardiovascular: Negative for chest pain and palpitations.   Genitourinary: Negative for frequency and urgency.   Neurological: Negative for dizziness and headaches.   Hematological: Negative for adenopathy. Does not bruise/bleed easily.          BP 114/70 (BP Site: Right arm, Patient Position: Sitting, Cuff Size: Medium)   Pulse 77   Temp 98 F (36.7 C) (Oral)   Resp 16   Ht 1.626 m (5\' 4" )   Wt 67.6 kg (149 lb)   SpO2 97%   BMI 25.58 kg/m     Objective:     Physical Exam   Constitutional: She is oriented to person, place, and time. She appears well-developed and well-nourished.   HENT:   Head: Normocephalic and atraumatic.   Left ear dull with fluid behind drum   Throat-mild erythema, no exudate     Eyes: EOM are normal. Pupils are equal, round, and reactive to light.   Neck: Normal range of motion. No thyromegaly present.   Cardiovascular: Normal rate, regular rhythm and normal heart sounds.    Pulmonary/Chest: Effort normal and breath sounds normal. No respiratory distress. She has no wheezes.   Musculoskeletal: She exhibits no edema or tenderness.   Lymphadenopathy:     She has no  cervical adenopathy.   Neurological: She is alert and oriented to person, place, and time. She has normal reflexes.   Skin: Skin is warm and dry.   Psychiatric: She has a normal mood and affect. Her behavior is normal.        Assessment:     There are no diagnoses linked to this encounter.      Plan:       ICD-10-CM    1. Sore throat J02.9 POCT rapid strep A    neg strep   no infx.  Continue flonase bid and add zyrtec.      Bradly Chris, MD

## 2016-11-20 ENCOUNTER — Encounter (INDEPENDENT_AMBULATORY_CARE_PROVIDER_SITE_OTHER): Payer: Self-pay | Admitting: Internal Medicine

## 2016-11-23 ENCOUNTER — Encounter (INDEPENDENT_AMBULATORY_CARE_PROVIDER_SITE_OTHER): Payer: Self-pay | Admitting: Internal Medicine

## 2016-11-24 ENCOUNTER — Ambulatory Visit (INDEPENDENT_AMBULATORY_CARE_PROVIDER_SITE_OTHER): Payer: HMO | Admitting: Internal Medicine

## 2016-11-24 ENCOUNTER — Telehealth (INDEPENDENT_AMBULATORY_CARE_PROVIDER_SITE_OTHER): Payer: Self-pay | Admitting: Internal Medicine

## 2016-11-24 ENCOUNTER — Emergency Department: Payer: HMO

## 2016-11-24 ENCOUNTER — Emergency Department
Admission: EM | Admit: 2016-11-24 | Discharge: 2016-11-24 | Disposition: A | Discharge status: Home / Self Care | Payer: HMO | Attending: Emergency Medicine | Admitting: Emergency Medicine

## 2016-11-24 DIAGNOSIS — Z79899 Other long term (current) drug therapy: Secondary | ICD-10-CM | POA: Insufficient documentation

## 2016-11-24 DIAGNOSIS — Z880 Allergy status to penicillin: Secondary | ICD-10-CM | POA: Insufficient documentation

## 2016-11-24 DIAGNOSIS — W540XXA Bitten by dog, initial encounter: Secondary | ICD-10-CM | POA: Insufficient documentation

## 2016-11-24 DIAGNOSIS — Z7982 Long term (current) use of aspirin: Secondary | ICD-10-CM | POA: Insufficient documentation

## 2016-11-24 DIAGNOSIS — Z87820 Personal history of traumatic brain injury: Secondary | ICD-10-CM | POA: Insufficient documentation

## 2016-11-24 DIAGNOSIS — Z85828 Personal history of other malignant neoplasm of skin: Secondary | ICD-10-CM | POA: Insufficient documentation

## 2016-11-24 DIAGNOSIS — Y92009 Unspecified place in unspecified non-institutional (private) residence as the place of occurrence of the external cause: Secondary | ICD-10-CM | POA: Insufficient documentation

## 2016-11-24 DIAGNOSIS — I10 Essential (primary) hypertension: Secondary | ICD-10-CM | POA: Insufficient documentation

## 2016-11-24 DIAGNOSIS — I252 Old myocardial infarction: Secondary | ICD-10-CM | POA: Insufficient documentation

## 2016-11-24 DIAGNOSIS — L03119 Cellulitis of unspecified part of limb: Secondary | ICD-10-CM | POA: Insufficient documentation

## 2016-11-24 DIAGNOSIS — S61452A Open bite of left hand, initial encounter: Secondary | ICD-10-CM | POA: Insufficient documentation

## 2016-11-24 DIAGNOSIS — E785 Hyperlipidemia, unspecified: Secondary | ICD-10-CM | POA: Insufficient documentation

## 2016-11-24 MED ORDER — DOXYCYCLINE MONOHYDRATE 100 MG PO CAPS
100.0000 mg | ORAL_CAPSULE | Freq: Two times a day (BID) | ORAL | 0 refills | Status: AC
Start: 2016-11-24 — End: 2016-12-04

## 2016-11-24 NOTE — ED Provider Notes (Signed)
EMERGENCY DEPARTMENT HISTORY AND PHYSICAL EXAM     Physician/Midlevel provider first contact with patient: 11/24/16 1305         Date: 11/24/2016  Patient Name: Cynthia Reyes  Attending Physician: Arville Care, MD  Advanced Practice Provider: Modesta Messing    History of Presenting Illness       History Provided By: Patient    Chief Complaint:  Chief Complaint   Patient presents with   . Dog bite     Onset: Sat  Timing: gradual  Location: left dorsal hand  Quality: pain  Severity: moderate  Exacerbating factors: none  Alleviating factors: tylenol 1pm  Associated Symptoms: redness, swelling  Pertinent Negatives: see ROS    Additional History: Cynthia Reyes is a 61 y.o. female RHD presenting to the ED with left dorsal hand redness/swelling s/p dog bite at home. Pt and her 4yo chocolate lab were playing and went for toy at the same time and pt sustained accidental bite wound to left dorsal hand over 3rd knuckle. Tetanus 07/2016. Dog UTD on rabies. Going to [facility] tomorrow. Cleaning wound with soap and water.    PCP: Orlene Plum, MD  SPECIALISTS:    No current facility-administered medications for this encounter.      Current Outpatient Prescriptions   Medication Sig Dispense Refill   . acetaminophen (TYLENOL) 500 MG tablet Take 500 mg by mouth.     Marland Kitchen aspirin 81 MG tablet Take 81 mg by mouth daily.       . Cholecalciferol (VITAMIN D-3 PO) Take by mouth.     . doxycycline (MONODOX) 100 MG capsule Take 1 capsule (100 mg total) by mouth 2 (two) times daily.for 10 days 20 capsule 0   . fluticasone (FLONASE) 50 MCG/ACT nasal spray 2 sprays by Nasal route daily. 1 g 11   . lisinopril (PRINIVIL,ZESTRIL) 5 MG tablet TAKE 1 TABLET DAILY 90 tablet 0   . Multiple Vitamins-Minerals (PRESERVISION AREDS PO) Take by mouth.     . nitroglycerin (NITROSTAT) 0.4 MG SL tablet Place 0.4 mg under the tongue every 5 (five) minutes as needed.       . pravastatin (PRAVACHOL) 80 MG tablet Take 80 mg by mouth daily.       Marland Kitchen  zolpidem (AMBIEN) 10 MG tablet Take 1 tablet (10 mg total) by mouth nightly as needed for Sleep. 30 tablet 0       Past History     Past Medical History:  Past Medical History:   Diagnosis Date   . Allergic rhinitis, cause unspecified    . Allergy     seasonal on Meds   . Chronic ischemic heart disease, unspecified     Dr. Fransisco Beau Cardiologist   . CVA (cerebral infarction)     old records reviewed mri 2006    . Fibroadenosis of breast    . Health maintenance examination GYN 2015 DERM 2015 On IP diet      Derm Exam tdap 2013 COLO needs one- referral given . pap will schedule with OBGYN Mammogram 2015- need Right br [place]   . Hyperlipidemia     On medication   . Hypertension     On Meds   . Knee pain     ortho - possible surgery in future   . Medical history non-contributory     Reviewed 03/04/11: Cath done 2010 with stent    . Myocardial infarction stress test in July 2012    05/23/2009- on meds  Cardiologist Dr. Luberta Mutter    . Myocardial infarction     2010 RCA stent had rehab   . Neoplasm of unspecified nature of bone, soft tissue, and skin     hx of AK reviewing old recordson 6/12   . Pain in both feet     back of calves- achilles tendon.  NO recent travel       Past Surgical History:  Past Surgical History:   Procedure Laterality Date   . CARDIAC CATHETERIZATION  2010   . DNC  83-85       Family History:  Family History   Problem Relation Age of Onset   . Arthritis Father    . Cancer Father      lung ca. smoker   . Hypertension Father    . Hyperlipidemia Father    . Osteoporosis Father    . Osteopenia Father    . Hypertension Mother    . Hyperlipidemia Mother    . Arthritis Paternal Grandmother    . Cancer Paternal Grandmother      lung ca. smoker   . Breast cancer Neg Hx    . Colon cancer Neg Hx    . Ovarian cancer Neg Hx    . Stroke Neg Hx    . Miscarriages / Stillbirths Neg Hx    . Preterm labor Neg Hx    . Eclampsia Neg Hx    . Diabetes Neg Hx        Social History:  Social History     Social History   . Marital status:  Married     Spouse name: N/A   . Number of children: N/A   . Years of education: N/A     Social History Main Topics   . Smoking status: Never Smoker   . Smokeless tobacco: Never Used   . Alcohol use Yes   . Drug use: No   . Sexual activity: Not Currently     Birth control/ protection: None     Other Topics Concern   . None     Social History Narrative   . None       Allergies:  Allergies   Allergen Reactions   . Penicillins        Review of Systems     Review of Systems   Constitutional: Negative for fever.   Skin:        Redness to skin s/p bite       Physical Exam     Vitals:    11/24/16 1302 11/24/16 1302   BP:  148/72   Pulse:  73   Resp:  16   Temp:  98 F (36.7 C)   SpO2:  98%   Weight: 65.8 kg    Height: 5\' 4"  (1.626 m)        Physical Exam   Constitutional: She is oriented to person, place, and time. She appears well-developed and well-nourished.   Husband at bedside. Pt nontoxic.   HENT:   Head: Normocephalic and atraumatic.   Eyes: Conjunctivae are normal. Right eye exhibits no discharge. Left eye exhibits no discharge. No scleral icterus.   Neck: Neck supple.   Cardiovascular: Intact distal pulses.    Pulmonary/Chest: Effort normal. No stridor. No respiratory distress.   Musculoskeletal: Normal range of motion.        Hands:  Neurological: She is alert and oriented to person, place, and time.   Skin: Skin is warm and dry.  Examined where exposed   Psychiatric: She has a normal mood and affect.   Nursing note and vitals reviewed.      Diagnostic Study Results     Labs -     Results     ** No results found for the last 24 hours. **          Radiologic Studies -   Radiology Results (24 Hour)     ** No results found for the last 24 hours. **      .    Medical Decision Making   I am the first provider for this patient.    I reviewed the vital signs, available nursing notes, past medical history, past surgical history, family history and social history.    Vital Signs-Reviewed the patient's vital signs.      Patient Vitals for the past 12 hrs:   BP Temp Pulse Resp   11/24/16 1302 148/72 98 F (36.7 C) 73 16       Pulse Oximetry Analysis - Normal 98% on RA      Procedures:   n/a    Old Medical Records: Nursing notes.     ED Course:   ED Course        Provider Notes:   61 yo f RHD with left hand dog bite. Tetanus utd. Rabies UTD. Wound care instructions given. Redness and swelling to dorsal hand but no focal abscess. Advised doxycycline and recheck in 2 days. Low threshold for admission and pt verbalized understanding and agrees with plan. Prefers discharge as she has plans to travel to [facility].     D/w Arville Care, MD who came to see pt.      Diagnosis     Clinical Impression:   1. Dog bite, initial encounter    2. Cellulitis of hand        Treatment Plan:   ED Disposition     ED Disposition Condition Date/Time Comment    Discharge  Tue Nov 24, 2016  1:33 PM Cynthia Reyes discharge to home/self care.    Condition at disposition: Stable            _______________________________    CHART OWNERSHIPDorette Grate, PA-C, am the primary clinician of record.  _______________________________       Modesta Messing, PA  11/24/16 Rickey Primus       Arville Care, MD  11/25/16 [phone]

## 2016-11-24 NOTE — ED Triage Notes (Signed)
Cynthia Reyes is a 61 y.o. female presents after dog bite Saturday. Lhand swollena nd red.BP 148/72   Pulse 73   Temp 98 F (36.7 C)   Resp 16   Ht 5\' 4"  (1.626 m)   Wt 65.8 kg   SpO2 98%   BMI 24.89 kg/m

## 2016-11-24 NOTE — Discharge Instructions (Signed)
Cellulitis     You were diagnosed with cellulitis.     This is a bacterial infection of the skin. Symptoms are usually redness, swelling, and warmth in the affected area. Some people get a fever (temperature higher than 100.4ºF / 38ºC) with this infection.     Keep the extremity (arm or leg) above your heart level if possible.     Cellulitis is treated with antibiotics. It is also treated by keeping the affected area elevated (up). Sometimes, antibiotics are given intravenously ("IV"). Other infections can be treated with oral (by mouth) medicines.     Redness, swelling, warmth, and fever should start to get better after 2-3 days of treatment. Come back here or go to the nearest Emergency Department or your primary doctor for a re-check as directed.     Return here or go to the nearest Emergency Department in 48 hours. This will be for another examination.     YOU SHOULD SEEK MEDICAL ATTENTION IMMEDIATELY, EITHER HERE OR AT THE NEAREST EMERGENCY DEPARTMENT, IF ANY OF THE FOLLOWING OCCURS:  · Redness spreads even with treatment. You can mark the infection area with a pen. This will help watch for improvement or spreading.  · Fever (temperature higher than 100.4ºF / 38ºC) doesn't go away or gets worse after 2-3 days of antibiotics.  · Unusual or increasing pain in the infected area.  · Lightheadedness.  · Feeling sicker at any time or not getting better as expected.                 Dog Bite     You have been treated for a dog bite.     Dog bites are common. However, they do not lead to infections as often as cat or human bites.     Antibiotics are often not needed to prevent infection after dog bites. Your doctor may or may not prescribe an antibiotic. The decision will be based on several things:  · Location of the wound. Bites to the hand are often treated with antibiotics to prevent infection.  · Age and health of the patient. Older patients are often prescribed antibiotics. Patients with pre-existing diseases  (Diabetes) are as well.     Rabies is rare in the [place]. However, an infected dog can pass it on. Successful vaccination programs began in the 1940s. They caused a decline in dog rabies in this country. Rabies is very rare in domestic dogs (living in homes with a family). Data from the Center for Disease Control (CDC) from 2001 showed less than 1% (1 out of 100) of all animal rabies cases were found in dogs.  · The rabies incubation period is long. Therefore, a domestic dog can be watched for abnormal behavior.  · Wild animals should be caught. They should be turned over to local health department authorities. Unless there is no choice, professional animal control officers should catch the dog. A test on the animal can be done to see if it is infected with rabies.  · Bites from animals that are unknown and not captured are at risk for transmitting rabies. Your doctor will decide whether rabies immunizations are needed. This will be based on local and state infection rates.  · Bats, foxes, dogs, raccoons, cats and skunks can pass on rabies.  · Rodents (squirrels, chipmunks, hamsters, rats, and mice) very rarely, if ever, carry rabies. The same is true for Lagomorphs (rabbits and hares).     Take old dressings off and put on a clean dry dressing every day. If the dressing sticks   to the wound, moisten it with water. It will then come off more easily.     Keep the wound clean and dry for the next 24 hours. Avoid excessive moisture. You can wash the wound gently with soap and water.     Put a clean, dry bandage over the wound if necessary. This is to protect the wound.     YOU SHOULD SEEK MEDICAL ATTENTION IMMEDIATELY, EITHER HERE OR AT THE NEAREST EMERGENCY DEPARTMENT, IF ANY OF THE FOLLOWING OCCURS:  · Unusual redness or swelling.  · Red streaks starting up the arm or leg.  · Drainage that smells very bad or the wound smells bad.  · Fever (temperature higher than 100.4ºF / 38ºC), chills, more pain and/or  swelling.     Tell your local animal control and/or health department about the bite. If you capture a wild animal, they will watch a live animal or have the animal tested for rabies.

## 2016-11-24 NOTE — Telephone Encounter (Signed)
Pt's spouse called to ask if his wife could be seen sooner than her 2:45p appointment with Dr. Judie Petit or if he should go to the ER. Pt spouse was informed to go the ER per Dr. Judie Petit. Pt spouse was upset with the practice and gave an "ultimatum" to see his wife sooner or he will be leaving the practice. Pt was infomed Dr. Patsy Lager He was asked if he would like to cancel the appointment. He did not

## 2017-01-26 ENCOUNTER — Ambulatory Visit (INDEPENDENT_AMBULATORY_CARE_PROVIDER_SITE_OTHER): Payer: Self-pay | Admitting: Internal Medicine

## 2017-02-03 ENCOUNTER — Ambulatory Visit (INDEPENDENT_AMBULATORY_CARE_PROVIDER_SITE_OTHER): Payer: HMO | Admitting: Internal Medicine

## 2017-02-03 VITALS — BP 140/84 | HR 65 | Temp 97.8°F | Resp 18 | Ht 64.0 in | Wt 151.4 lb

## 2017-02-03 DIAGNOSIS — M25559 Pain in unspecified hip: Secondary | ICD-10-CM

## 2017-02-03 DIAGNOSIS — J309 Allergic rhinitis, unspecified: Secondary | ICD-10-CM

## 2017-02-03 MED ORDER — AZELASTINE HCL 0.1 % NA SOLN
2.0000 | Freq: Two times a day (BID) | NASAL | 5 refills | Status: DC
Start: 2017-02-03 — End: 2018-01-18

## 2017-02-03 NOTE — Addendum Note (Signed)
Addended by: Shea Evans on: 02/03/2017 10:58 AM     Modules accepted: Orders

## 2017-02-03 NOTE — Progress Notes (Signed)
Have you seen any specialists/other providers since your last visit with us?      No      Arm preference verified?     Yes    The patient is due for shingles vaccine and [facility] letter

## 2017-02-03 NOTE — Progress Notes (Signed)
Subjective:      Patient ID: Cynthia Reyes is a 61 y.o. female.    Chief Complaint:  Chief Complaint   Patient presents with   . Hip Pain       HPI:  Hip Pain    The incident occurred more than 1 week ago. The incident occurred at home. There was no injury mechanism. The pain is present in the right hip. Pertinent negatives include no inability to bear weight, loss of motion, loss of sensation, muscle weakness, numbness or tingling.       Problem List:  Patient Active Problem List   Diagnosis   . Allergy   . Myocardial infarction   . Hypertension   . Hyperlipidemia   . Health maintenance examination   . Pain in both feet   . Chronic ischemic heart disease, unspecified   . Fibroadenosis of breast   . Allergic rhinitis, cause unspecified   . Neoplasm of unspecified nature of bone, soft tissue, and skin   . CVA (cerebral infarction)   . Medical history non-contributory       Current Medications:  Current Outpatient Prescriptions   Medication Sig Dispense Refill   . acetaminophen (TYLENOL) 500 MG tablet Take 500 mg by mouth.     Marland Kitchen aspirin 81 MG tablet Take 81 mg by mouth daily.       . Cholecalciferol (VITAMIN D-3 PO) Take by mouth.     . fluticasone (FLONASE) 50 MCG/ACT nasal spray 2 sprays by Nasal route daily. 1 g 11   . lisinopril (PRINIVIL,ZESTRIL) 5 MG tablet TAKE 1 TABLET DAILY 90 tablet 0   . Multiple Vitamins-Minerals (PRESERVISION AREDS PO) Take by mouth.     . nitroglycerin (NITROSTAT) 0.4 MG SL tablet Place 0.4 mg under the tongue every 5 (five) minutes as needed.       . pravastatin (PRAVACHOL) 80 MG tablet Take 80 mg by mouth daily.       Marland Kitchen zolpidem (AMBIEN) 10 MG tablet Take 1 tablet (10 mg total) by mouth nightly as needed for Sleep. 30 tablet 0     No current facility-administered medications for this visit.        Allergies:  Allergies   Allergen Reactions   . Penicillins        Past Medical History:  Past Medical History:   Diagnosis Date   . Allergic rhinitis, cause unspecified    . Allergy      seasonal on Meds   . Chronic ischemic heart disease, unspecified     Dr. Fransisco Beau Cardiologist   . CVA (cerebral infarction)     old records reviewed mri 2006    . Fibroadenosis of breast    . Health maintenance examination GYN 2015 DERM 2015 On IP diet      Derm Exam tdap 2013 COLO needs one- referral given . pap will schedule with OBGYN Mammogram 2015- need Right br [place]   . Hyperlipidemia     On medication   . Hypertension     On Meds   . Knee pain     ortho - possible surgery in future   . Medical history non-contributory     Reviewed 03/04/11: Cath done 2010 with stent    . Myocardial infarction stress test in July 2012    05/23/2009- on meds Cardiologist Dr. Luberta Mutter    . Myocardial infarction     2010 RCA stent had rehab   . Neoplasm of unspecified nature of bone, soft  tissue, and skin     hx of AK reviewing old recordson 6/12   . Pain in both feet     back of calves- achilles tendon.  NO recent travel       Past Surgical History:  Past Surgical History:   Procedure Laterality Date   . CARDIAC CATHETERIZATION  2010   . DNC  [age]       Family History:  Family History   Problem Relation Age of Onset   . Arthritis Father    . Cancer Father      lung ca. smoker   . Hypertension Father    . Hyperlipidemia Father    . Osteoporosis Father    . Osteopenia Father    . Hypertension Mother    . Hyperlipidemia Mother    . Arthritis Paternal Grandmother    . Cancer Paternal Grandmother      lung ca. smoker   . Breast cancer Neg Hx    . Colon cancer Neg Hx    . Ovarian cancer Neg Hx    . Stroke Neg Hx    . Miscarriages / Stillbirths Neg Hx    . Preterm labor Neg Hx    . Eclampsia Neg Hx    . Diabetes Neg Hx        Social History:  Social History     Social History   . Marital status: Married     Spouse name: N/A   . Number of children: N/A   . Years of education: N/A     Occupational History   . Not on file.     Social History Main Topics   . Smoking status: Never Smoker   . Smokeless tobacco: Never Used   . Alcohol use Yes   . Drug  use: No   . Sexual activity: Not Currently     Birth control/ protection: None     Other Topics Concern   . Not on file     Social History Narrative   . No narrative on file       The following sections were reviewed this encounter by the provider:        ROS:  Review of Systems   Neurological: Negative for tingling and numbness.       Vitals:  BP 140/84   Pulse 65   Temp 97.8 F (36.6 C) (Oral)   Resp 18   Ht 1.626 m (5\' 4" )   Wt 68.7 kg (151 lb 6.4 oz)   BMI 25.99 kg/m      Objective:     Physical Exam:  Physical Exam   Constitutional: She appears well-developed and well-nourished.   Cardiovascular: Normal rate and regular rhythm.    Pulmonary/Chest: Effort normal and breath sounds normal.   Abdominal: Soft. Bowel sounds are normal.   Musculoskeletal: She exhibits no edema, tenderness or deformity.   Skin: Skin is warm and dry.   Nursing note and vitals reviewed.       Assessment:     There are no diagnoses linked to this encounter.    Plan:     61 year old in for hip pain  Refer to Ortho     Jayma Volpi Angelene Giovanni, MD

## 2017-02-12 ENCOUNTER — Encounter (INDEPENDENT_AMBULATORY_CARE_PROVIDER_SITE_OTHER): Payer: Self-pay | Admitting: Orthopaedic Surgery

## 2017-02-12 ENCOUNTER — Ambulatory Visit (INDEPENDENT_AMBULATORY_CARE_PROVIDER_SITE_OTHER): Payer: HMO | Admitting: Orthopaedic Surgery

## 2017-02-12 VITALS — BP 119/76 | HR 71 | Ht 64.0 in | Wt 151.0 lb

## 2017-02-12 DIAGNOSIS — M7061 Trochanteric bursitis, right hip: Secondary | ICD-10-CM

## 2017-02-12 DIAGNOSIS — M25559 Pain in unspecified hip: Secondary | ICD-10-CM

## 2017-02-12 MED ORDER — TRIAMCINOLONE ACETONIDE 40 MG/ML IJ SUSP
40.0000 mg | Freq: Once | INTRAMUSCULAR | Status: AC
Start: 2017-02-12 — End: 2017-02-12
  Administered 2017-02-12: 09:00:00 40 mg via INTRA_ARTICULAR

## 2017-02-12 NOTE — Progress Notes (Signed)
Chief complaint: Right hip pain    History:  61 year old female presents for right hip pain is been going on now for several weeks is over the lateral aspect of the hip.  She denies any traumatic incidences.  She denies any fevers or chills is worse with walking, its better with rest.  It bothers her when she sleeps on the affected hip.  She denies any pain elsewhere.  The pain is of an aching nature.  She has not had any intervention thus far.    Past medical history, surgical history and social history are in the chart and have been reviewed  Family history noncontributory    ALLERGIES and medications are in the chart and I reviewed them    Review of symptoms: Negative for fevers, chills, chest pain, shortness of breath.  Remainder of the review symptoms was otherwise fully negative for all 10 systems    Physical Examination:  Alert and oriented female in no acute distress afebrile, vital signs stable.  Examination of the bilateral lower extremities reveals she sensory and vascularly intact with 5 out of 5 strength all major muscle groups.  Right hip shows excellent range of motion without pain today.  There is tenderness palpation laterally over the greater trochanter and pain here with resisted hip abduction.  Full painless range of motion of The Knee Ankle Are Noted.    Impression plan: 61 year old female has right trochanteric bursitis.  She will benefit from conservative course of treatment.  We will begin her on a course of physical therapy and give her cortisone injection today.  She will follow me as needed.    Procedure: Right hip trochanteric bursa was injected with 1 mL of Kenalog and 4 mL of lidocaine in a sterile manner.  The patient tolerated this well.

## 2017-03-30 ENCOUNTER — Encounter (INDEPENDENT_AMBULATORY_CARE_PROVIDER_SITE_OTHER): Payer: Self-pay | Admitting: Internal Medicine

## 2017-04-28 ENCOUNTER — Telehealth (INDEPENDENT_AMBULATORY_CARE_PROVIDER_SITE_OTHER): Payer: Self-pay

## 2017-04-28 NOTE — Telephone Encounter (Signed)
Cynthia Reyes called: her hip pain has returned and she would like another injection. We have scheduled her visit for 05/12/17. EP

## 2017-05-12 ENCOUNTER — Encounter (INDEPENDENT_AMBULATORY_CARE_PROVIDER_SITE_OTHER): Payer: Self-pay | Admitting: Orthopaedic Surgery

## 2017-05-12 ENCOUNTER — Ambulatory Visit (INDEPENDENT_AMBULATORY_CARE_PROVIDER_SITE_OTHER): Payer: HMO | Admitting: Orthopaedic Surgery

## 2017-05-12 VITALS — BP 122/71 | HR 79 | Ht 64.0 in | Wt 151.0 lb

## 2017-05-12 DIAGNOSIS — M7061 Trochanteric bursitis, right hip: Secondary | ICD-10-CM

## 2017-05-12 MED ORDER — TRIAMCINOLONE ACETONIDE 40 MG/ML IJ SUSP
40.0000 mg | Freq: Once | INTRAMUSCULAR | Status: AC
Start: 2017-05-12 — End: 2017-05-12
  Administered 2017-05-12: 10:00:00 40 mg via INTRA_ARTICULAR

## 2017-05-12 NOTE — Progress Notes (Signed)
Chief complaint: Follow-up right hip pain    History:  She comes back in follow-up for right hip trochanteric bursitis passively gave her a cortisone injection gave her excellent relief for some time.  She worked with physical therapy and this is helped her tremendously as well.  She wants to continue this.  She denies any subsequent traumas to the hip.  She denies any fevers or chills.  She is getting ready for a trip and wants to get some relief before hand.    Physical Examination:  Alert and oriented female in no acute distress afebrile, vital signs stable.  Examination of the bilateral lower extremities reveals she sensory and vascularly intact with 5 out of 5 strength all major muscle groups.  Right hip shows excellent range of motion tenderness to palpation is noted over the right trochanteric bursa pain here with resisted abduction.    Impression and plan:  61 year old female right hip trochanteric bursitis.  She will benefit from continued conservative treatment.  We will give her cortisone injection today.  We will renew her physical therapy.  She may take anti-inflammatories as needed.  She will follow up with me as needed.    Procedure: The right hip was injected in the trochanteric bursa with 1 mL of Kenalog and 4 mL of lidocaine in a sterile manner.  The patient tolerated this well.

## 2017-06-29 ENCOUNTER — Encounter (INDEPENDENT_AMBULATORY_CARE_PROVIDER_SITE_OTHER): Payer: Self-pay

## 2017-09-22 ENCOUNTER — Encounter (INDEPENDENT_AMBULATORY_CARE_PROVIDER_SITE_OTHER): Payer: Self-pay | Admitting: Internal Medicine

## 2017-10-07 ENCOUNTER — Telehealth (INDEPENDENT_AMBULATORY_CARE_PROVIDER_SITE_OTHER): Payer: Self-pay | Admitting: Internal Medicine

## 2017-10-07 NOTE — Telephone Encounter (Signed)
Discussed with pharmacy changed supplement packaging. Patient wants supplements 1 tab twice daily instead of 2 tabs once daily.

## 2017-10-07 NOTE — Telephone Encounter (Signed)
[facility] calling regarding Rx (x3). Pt would like different packaging which requires an updated Rx.  [facility] CB# [phone]

## 2017-10-08 ENCOUNTER — Other Ambulatory Visit (INDEPENDENT_AMBULATORY_CARE_PROVIDER_SITE_OTHER): Payer: Self-pay

## 2017-10-08 ENCOUNTER — Encounter (INDEPENDENT_AMBULATORY_CARE_PROVIDER_SITE_OTHER): Payer: Self-pay

## 2017-11-26 ENCOUNTER — Encounter (INDEPENDENT_AMBULATORY_CARE_PROVIDER_SITE_OTHER): Payer: Self-pay | Admitting: Internal Medicine

## 2017-12-10 ENCOUNTER — Other Ambulatory Visit: Payer: HMO

## 2017-12-10 ENCOUNTER — Telehealth (INDEPENDENT_AMBULATORY_CARE_PROVIDER_SITE_OTHER): Payer: Self-pay | Admitting: Internal Medicine

## 2017-12-10 ENCOUNTER — Other Ambulatory Visit (INDEPENDENT_AMBULATORY_CARE_PROVIDER_SITE_OTHER): Payer: Self-pay

## 2017-12-10 NOTE — Telephone Encounter (Signed)
Pt has an appt scheduled for 05/14. She would like to request a short supply of the following medications, Multiple Vitamins-Minerals (PRESERVISION AREDS PO) and Cholecalciferol (VITAMIN D-3 PO) Please advise, pt can best be reached at [phone].    [facility] - [place], [place] - [address] [phone] (Phone)  [phone] (Fax)

## 2017-12-13 MED ORDER — PRESERVISION AREDS PO TABS
1.0000 | ORAL_TABLET | Freq: Every day | ORAL | 0 refills | Status: DC
Start: 2017-12-13 — End: 2018-01-18

## 2017-12-13 MED ORDER — VITAMIN D-3 25 MCG (1000 UT) PO CAPS
1.0000 | ORAL_CAPSULE | Freq: Every day | ORAL | 0 refills | Status: DC
Start: 2017-12-13 — End: 2018-01-18

## 2017-12-13 NOTE — Telephone Encounter (Signed)
Patient needs apt last seen in 01/2017

## 2017-12-20 ENCOUNTER — Telehealth (INDEPENDENT_AMBULATORY_CARE_PROVIDER_SITE_OTHER): Payer: Self-pay | Admitting: Internal Medicine

## 2017-12-20 NOTE — Telephone Encounter (Signed)
Pt called to check on the status of the medication request that was sent and filled. After letting the pt know that the medication has been approved it appears that the medications were sent to the wrong pharmacy location. Pt's medication was sent to [facility] instead of going to the following pharmacy    [id number] [place], [place] - [address] AT [facility] [phone] (Phone)  [phone] (Fax)     Pt would like the medication sent to the above pharmacy. Pt has an appt on 01/18/18.     Please contact pt once medication has been sent or if there are any questions.     Pt can be reached at: [phone]

## 2017-12-21 ENCOUNTER — Other Ambulatory Visit (INDEPENDENT_AMBULATORY_CARE_PROVIDER_SITE_OTHER): Payer: Self-pay

## 2017-12-21 NOTE — Telephone Encounter (Signed)
Looks like Dr. Celine Mans already sent prescriptions for AREDS and vitamin D to pharmacy on 12/13/17.  Will not send them again.

## 2017-12-22 ENCOUNTER — Telehealth (INDEPENDENT_AMBULATORY_CARE_PROVIDER_SITE_OTHER): Payer: Self-pay

## 2017-12-22 NOTE — Telephone Encounter (Signed)
Called pharmacy, no refills needed

## 2017-12-22 NOTE — Telephone Encounter (Signed)
Pt requested refills on vit D 1000 IU and Multivitamin Preservisition.     [facility] and pharmacist confirmed that both medications had enough refills for about a year and there is no need for refills.

## 2018-01-04 ENCOUNTER — Encounter (INDEPENDENT_AMBULATORY_CARE_PROVIDER_SITE_OTHER): Payer: Self-pay | Admitting: Internal Medicine

## 2018-01-18 ENCOUNTER — Encounter (INDEPENDENT_AMBULATORY_CARE_PROVIDER_SITE_OTHER): Payer: Self-pay | Admitting: Internal Medicine

## 2018-01-18 ENCOUNTER — Other Ambulatory Visit (INDEPENDENT_AMBULATORY_CARE_PROVIDER_SITE_OTHER): Payer: Self-pay | Admitting: Internal Medicine

## 2018-01-18 ENCOUNTER — Encounter (INDEPENDENT_AMBULATORY_CARE_PROVIDER_SITE_OTHER): Payer: Self-pay

## 2018-01-18 ENCOUNTER — Ambulatory Visit (INDEPENDENT_AMBULATORY_CARE_PROVIDER_SITE_OTHER): Payer: 59 | Admitting: Internal Medicine

## 2018-01-18 VITALS — BP 140/76 | HR 87 | Temp 97.6°F | Resp 18 | Ht 64.0 in | Wt 156.0 lb

## 2018-01-18 DIAGNOSIS — Z9229 Personal history of other drug therapy: Secondary | ICD-10-CM

## 2018-01-18 DIAGNOSIS — Z2839 Other underimmunization status: Secondary | ICD-10-CM

## 2018-01-18 DIAGNOSIS — Z1159 Encounter for screening for other viral diseases: Secondary | ICD-10-CM

## 2018-01-18 DIAGNOSIS — J069 Acute upper respiratory infection, unspecified: Secondary | ICD-10-CM

## 2018-01-18 DIAGNOSIS — Z283 Underimmunization status: Secondary | ICD-10-CM

## 2018-01-18 DIAGNOSIS — J309 Allergic rhinitis, unspecified: Secondary | ICD-10-CM

## 2018-01-18 DIAGNOSIS — Z1211 Encounter for screening for malignant neoplasm of colon: Secondary | ICD-10-CM

## 2018-01-18 MED ORDER — AZELASTINE HCL 0.1 % NA SOLN
2.0000 | Freq: Two times a day (BID) | NASAL | 5 refills | Status: AC
Start: 2018-01-18 — End: 2019-01-18

## 2018-01-18 MED ORDER — PRESERVISION AREDS PO TABS
1.00 | ORAL_TABLET | Freq: Every day | ORAL | 3 refills | Status: AC
Start: 2018-01-18 — End: ?

## 2018-01-18 MED ORDER — AZITHROMYCIN 250 MG PO TABS
ORAL_TABLET | ORAL | 0 refills | Status: AC
Start: 2018-01-18 — End: 2018-01-24

## 2018-01-18 MED ORDER — VITAMIN D-3 25 MCG (1000 UT) PO CAPS
1.00 | ORAL_CAPSULE | Freq: Every day | ORAL | 3 refills | Status: AC
Start: 2018-01-18 — End: ?

## 2018-01-18 NOTE — Progress Notes (Signed)
Subjective:      Patient ID: Cynthia Reyes is a 62 y.o. female.    Chief Complaint:  Chief Complaint   Patient presents with   . Hypertension   . URI       HPI:  She has neck pain.        Hypertension   The patient is being seen for a routine follow-up of hypertension. There is no interval history of chest pain, chest pressure/discomfort, claudication, irregular heart beat, lower extremity edema, near-syncope, palpitations, paroxysmal nocturnal dyspnea and tachypnea.     URI    This is a new problem. The current episode started in the past 7 days. The problem has been unchanged. Associated symptoms include congestion, rhinorrhea and sinus pain. Pertinent negatives include no abdominal pain, chest pain, diarrhea, headaches, joint swelling, neck pain, plugged ear sensation, sore throat, swollen glands or vomiting.       Problem List:  Patient Active Problem List   Diagnosis   . Allergy   . Myocardial infarction   . Hypertension   . Hyperlipidemia   . Health maintenance examination   . Pain in both feet   . Chronic ischemic heart disease, unspecified   . Fibroadenosis of breast   . Allergic rhinitis, cause unspecified   . Neoplasm of unspecified nature of bone, soft tissue, and skin   . CVA (cerebral infarction)   . Medical history non-contributory       Current Medications:  Current Outpatient Prescriptions   Medication Sig Dispense Refill   . aspirin 81 MG tablet Take 81 mg by mouth daily.       Marland Kitchen azelastine (ASTELIN) 0.1 % nasal spray 2 sprays by Nasal route 2 (two) times daily.Use in each nostril as directed 30 mL 5   . Cholecalciferol (VITAMIN D-3) 1000 units Cap Take 1 capsule by mouth daily 30 capsule 0   . fluticasone (FLONASE) 50 MCG/ACT nasal spray 2 sprays by Nasal route daily. 1 g 11   . lisinopril (PRINIVIL,ZESTRIL) 5 MG tablet TAKE 1 TABLET DAILY 90 tablet 0   . Multiple Vitamins-Minerals (PRESERVISION AREDS) Tab Take 1 tablet by mouth daily 30 tablet 0   . pravastatin (PRAVACHOL) 80 MG tablet Take 80  mg by mouth daily.       Marland Kitchen acetaminophen (TYLENOL) 500 MG tablet Take 500 mg by mouth.     . nitroglycerin (NITROSTAT) 0.4 MG SL tablet Place 0.4 mg under the tongue every 5 (five) minutes as needed.       . zolpidem (AMBIEN) 10 MG tablet Take 1 tablet (10 mg total) by mouth nightly as needed for Sleep. 30 tablet 0     No current facility-administered medications for this visit.        Allergies:  Allergies   Allergen Reactions   . Penicillins        Past Medical History:  Past Medical History:   Diagnosis Date   . Allergic rhinitis, cause unspecified    . Allergy     seasonal on Meds   . Chronic ischemic heart disease, unspecified     Dr. Fransisco Beau Cardiologist   . CVA (cerebral infarction)     old records reviewed mri 2006    . Fibroadenosis of breast    . Health maintenance examination GYN 2015 DERM 2015 On IP diet      Derm Exam tdap 2013 COLO needs one- referral given . pap will schedule with OBGYN Mammogram 2015- need Right br [place]   .  Hyperlipidemia     On medication   . Hypertension     On Meds   . Knee pain     ortho - possible surgery in future   . Medical history non-contributory     Reviewed 03/04/11: Cath done 2010 with stent    . Myocardial infarction stress test in July 2012    05/23/2009- on meds Cardiologist Dr. Luberta Mutter    . Myocardial infarction     2010 RCA stent had rehab   . Neoplasm of unspecified nature of bone, soft tissue, and skin     hx of AK reviewing old recordson 6/12   . Pain in both feet     back of calves- achilles tendon.  NO recent travel       Past Surgical History:  Past Surgical History:   Procedure Laterality Date   . CARDIAC CATHETERIZATION  2010   . DNC  83-85       Family History:  Family History   Problem Relation Age of Onset   . Arthritis Father    . Cancer Father         lung ca. smoker   . Hypertension Father    . Hyperlipidemia Father    . Osteoporosis Father    . Osteopenia Father    . Hypertension Mother    . Hyperlipidemia Mother    . Arthritis Paternal Grandmother    . Cancer  Paternal Grandmother         lung ca. smoker   . Breast cancer Neg Hx    . Colon cancer Neg Hx    . Ovarian cancer Neg Hx    . Stroke Neg Hx    . Miscarriages / Stillbirths Neg Hx    . Preterm labor Neg Hx    . Eclampsia Neg Hx    . Diabetes Neg Hx        Social History:  Social History     Social History   . Marital status: Married     Spouse name: N/A   . Number of children: N/A   . Years of education: N/A     Occupational History   . Not on file.     Social History Main Topics   . Smoking status: Never Smoker   . Smokeless tobacco: Never Used   . Alcohol use Yes   . Drug use: No   . Sexual activity: Not Currently     Birth control/ protection: None     Other Topics Concern   . Not on file     Social History Narrative   . No narrative on file       The following sections were reviewed this encounter by the provider: Meds           ROS:  Review of Systems   HENT: Positive for congestion, rhinorrhea and sinus pain. Negative for sore throat.    Cardiovascular: Negative for chest pain.   Gastrointestinal: Negative for abdominal pain, diarrhea and vomiting.   Musculoskeletal: Negative for neck pain.   Neurological: Negative for headaches.       Vitals:  BP 140/76 (BP Site: Right arm, Patient Position: Sitting, Cuff Size: Medium)   Pulse 87   Temp 97.6 F (36.4 C) (Oral)   Resp 18   Ht 1.626 m (5\' 4" )   Wt 70.8 kg (156 lb)   SpO2 97%   BMI 26.78 kg/m      Objective:  Physical Exam:  Physical Exam   Constitutional: She appears well-developed and well-nourished.   Cardiovascular: Normal rate and regular rhythm.    Pulmonary/Chest: Effort normal and breath sounds normal.   Abdominal: Soft. Bowel sounds are normal.   Nursing note and vitals reviewed.       Assessment:     There are no diagnoses linked to this encounter.    Plan:     63 year old in for high blood pressure and URI   1. URI   rx zpack only if not better  2. High blood pressure  On meds  Has been taking cold meds  Is going to get labs with Dr Moise Boring, MD

## 2018-01-18 NOTE — Addendum Note (Signed)
Addended by: Shea Evans on: 01/18/2018 02:42 PM     Modules accepted: Orders

## 2018-01-19 LAB — RUBELLA ANTIBODY, IGG: Rubella AB, IgG: 6.77 index (ref >0.99)

## 2018-01-19 LAB — HEPATITIS C ANTIBODY: HCV AB: <0.1 s/co ratio (ref 0.0–0.9)

## 2018-01-20 LAB — RUBEOLA ANTIBODY IGG: Rubeola (Measles), IgG: >300 AU/mL (ref >29.9)

## 2018-01-20 LAB — MUMPS ANTIBODY, IGG: Mumps, IgG: 245 AU/mL (ref >10.9)

## 2018-03-17 ENCOUNTER — Ambulatory Visit (INDEPENDENT_AMBULATORY_CARE_PROVIDER_SITE_OTHER): Payer: 59 | Admitting: Internal Medicine

## 2018-04-12 ENCOUNTER — Encounter (INDEPENDENT_AMBULATORY_CARE_PROVIDER_SITE_OTHER): Payer: Self-pay | Admitting: Internal Medicine

## 2018-05-19 ENCOUNTER — Encounter (INDEPENDENT_AMBULATORY_CARE_PROVIDER_SITE_OTHER): Payer: Self-pay | Admitting: Internal Medicine

## 2021-11-13 IMAGING — MR MRI RIGHT KNEE WITHOUT CONTRAST
4 of 5 series · 26 of 40 positions shown · IV contrast (gadolinium)
Comparison: 10/15/2021 radiographs

________________________________________________________________________________________________ 
MRI RIGHT KNEE WITHOUT CONTRAST, 11/13/2021 [DATE]: 
CLINICAL INDICATION: Right knee pain
TECHNIQUE: Multiplanar, multiecho position MR images of the knee were performed 
without intravenous gadolinium enhancement. Patient was scanned on a
magnet.

[Series 201: survey_ · axial · 10.0mm · 0.78mm/px · z∈[-20,+125]mm · 3 of 9 slices shown]
[im 1/9]
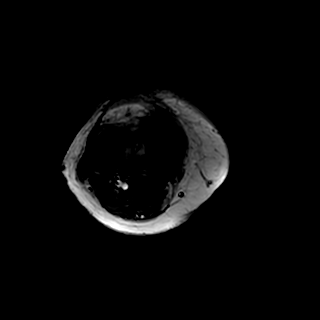
[im 5/9]
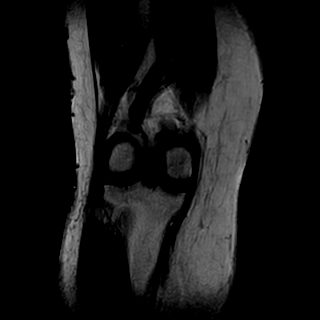
[im 9/9]
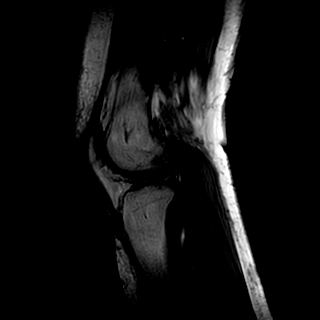

[Series 301: (person_name)_(person_name)_(person_name) · axial · 3.0mm · 0.36mm/px · z∈[-75,+47]mm · 8 of 36 slices shown]
[im 1/36]
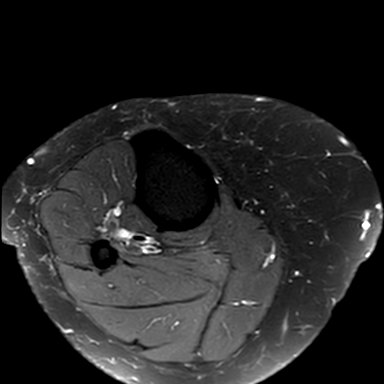
[im 4/36]
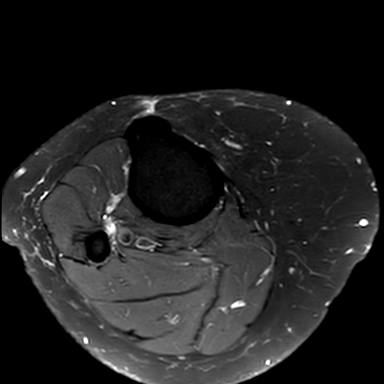
[im 12/36]
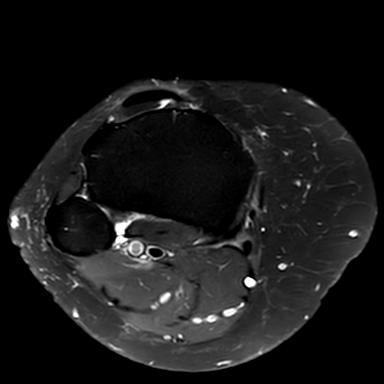
[im 16/36]
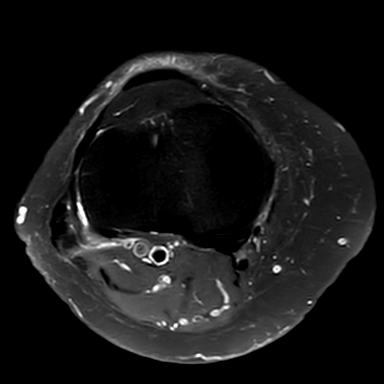
[im 20/36]
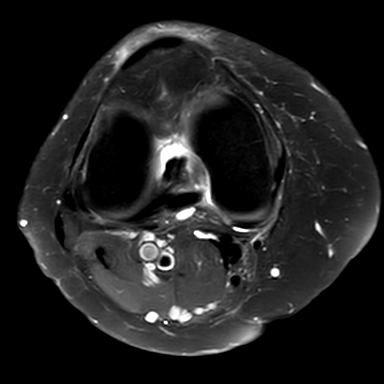
[im 24/36]
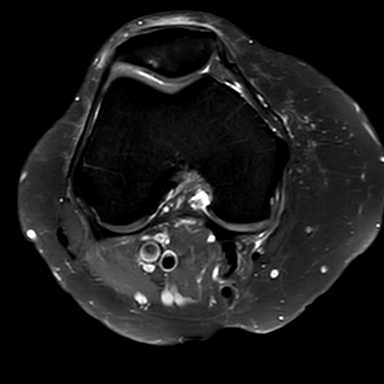
[im 32/36]
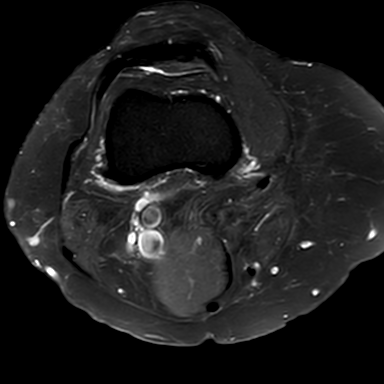
[im 36/36]
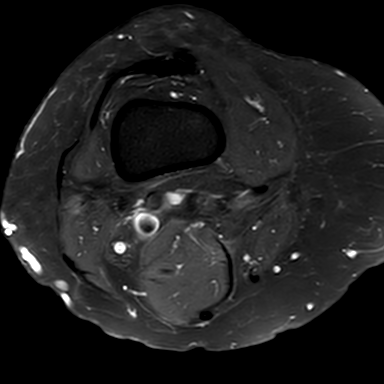

[Series 401: pd_fs_sag fh · sagittal · 3.0mm · 0.29mm/px · 9 of 32 slices shown]
[im 1/32]
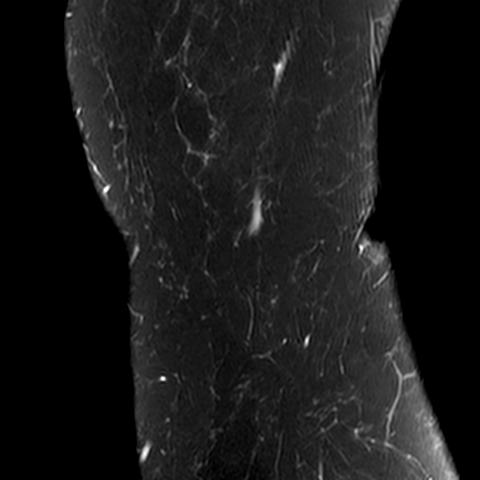
[im 4/32]
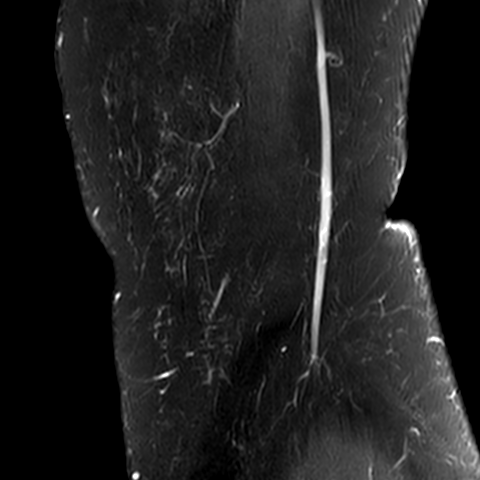
[im 8/32]
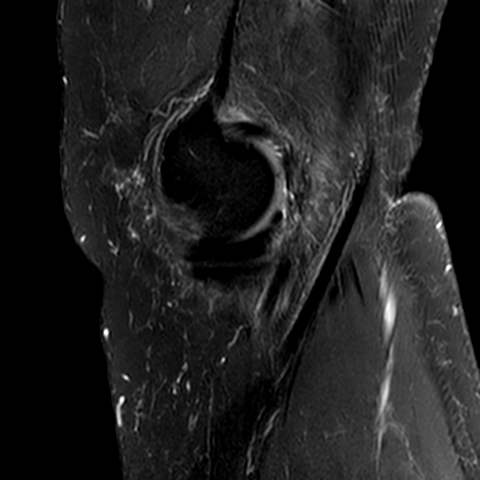
[im 12/32]
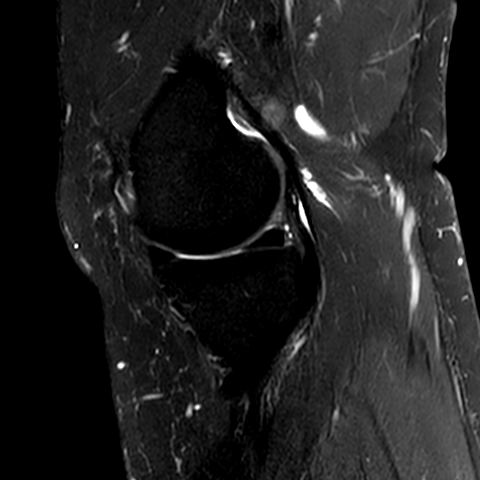
[im 16/32]
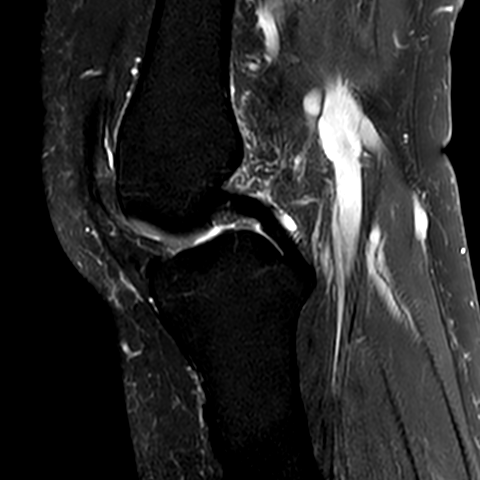
[im 20/32]
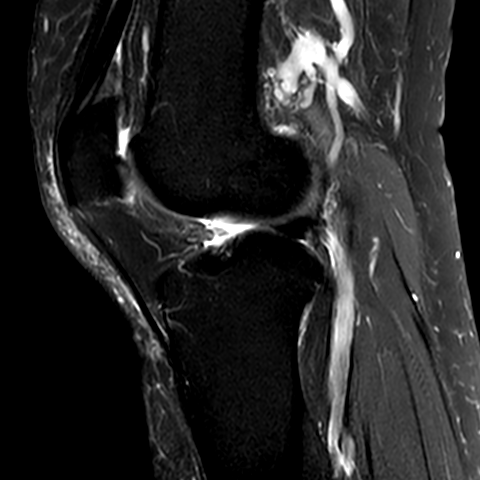
[im 24/32]
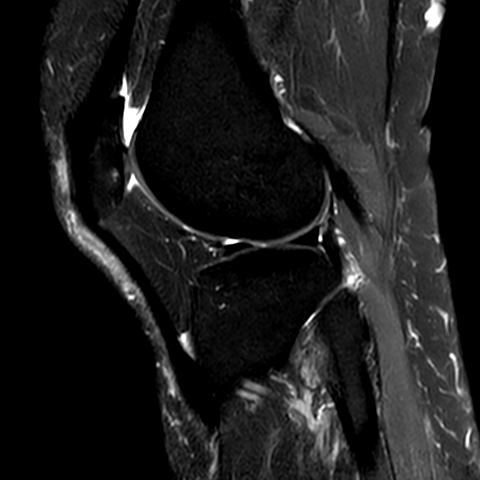
[im 28/32]
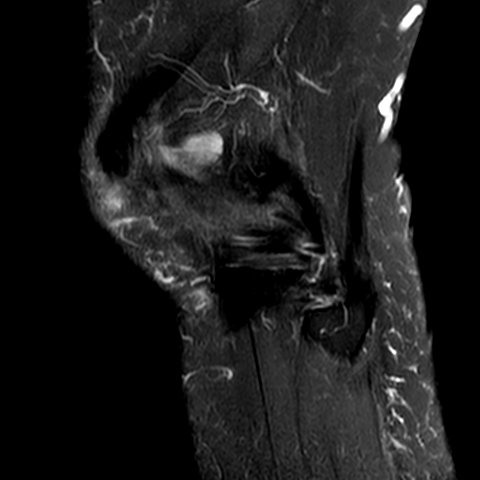
[im 32/32]
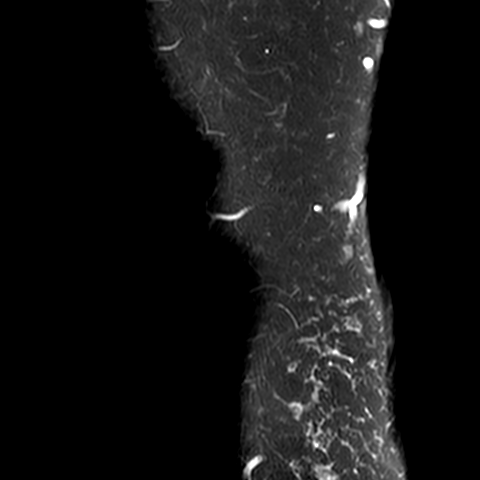

[Series 501: t1_cor · coronal · 3.0mm · 0.31mm/px · 6 of 32 slices shown]
[im 1/32]
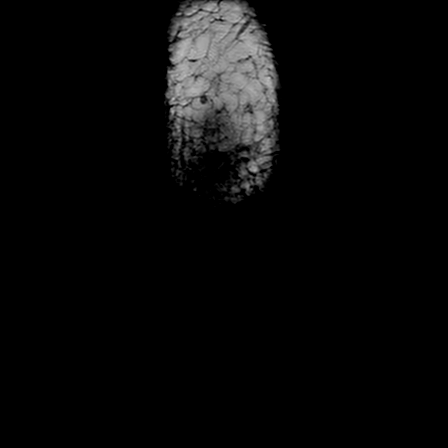
[im 4/32]
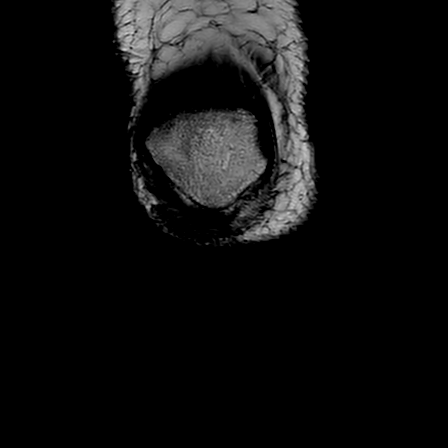
[im 8/32]
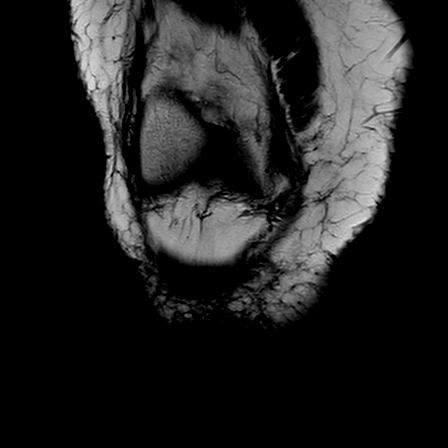
[im 12/32]
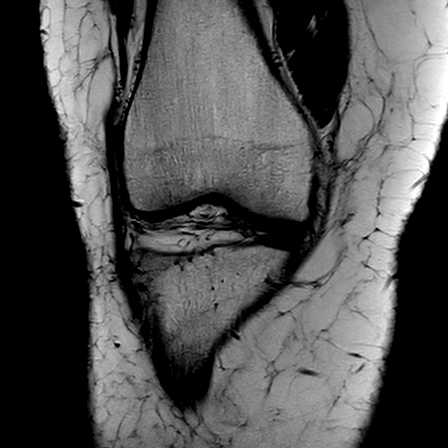
[im 16/32]
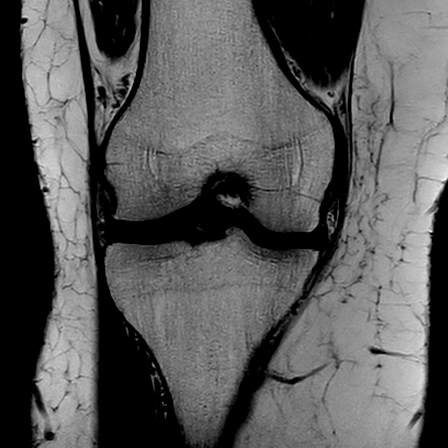
[im 28/32]
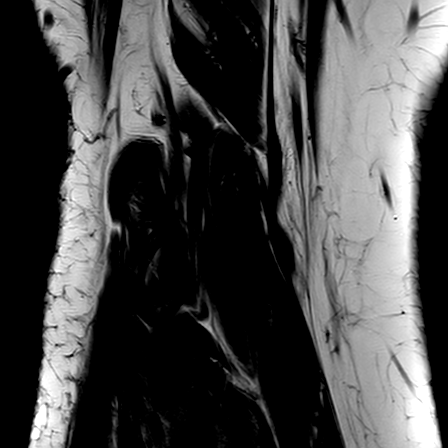

[26 of 40 positions shown; findings below may reference images not displayed]

FINDINGS: MEDIAL COMPARTMENT: Small horizontal tear of the posterior horn of the medial 
meniscus. No medial meniscal extrusion. Articular cartilage is preserved. 
LATERAL COMPARTMENT: The lateral meniscus is intact without tear or extrusion. 
Small grade III cartilage fissures. 
PATELLOFEMORAL COMPARTMENT: The patella is centrally located. Up to grade IV 
chondromalacia patella. No focal trochlear cartilaginous defects. 
TIBIOFIBULAR COMPARTMENT: Negative. 
LIGAMENTS: The anterior cruciate ligament is intact. The posterior cruciate 
ligament is intact. The medial collateral ligament and lateral collateral 
ligaments are preserved. 
EXTENSOR MECHANISM: The quadriceps and patellar tendon are preserved. The medial 
and lateral retinacula are intact. 
POSTEROMEDIAL CORNER: The semimembranosus and pes anserine tendons are 
preserved. The posterior oblique ligament and posterior medial joint capsule are 
intact. 
POSTEROLATERAL CORNER: The popliteal tendon and popliteofibular ligament are 
intact. The biceps femoris is negative. 
BONES: Normal bone marrow signal intensity. No fracture or contusion or stress 
response.  
ADDITIONAL FINDINGS: No knee joint effusion. Small popliteal cyst. The 
musculature is normal without mass, signal abnormality or atrophy. Superficial 
varicosities. Mild anterior subcutaneous soft tissue swelling.
IMPRESSION: 1.  Small horizontal tear of the posterior horn of the medial meniscus. 
2.  Marked patellar and mild lateral compartment chondromalacia. 
3.  Small popliteal cyst, superficial varicosities and mild anterior 
subcutaneous soft tissue swelling.

## 2023-05-31 IMAGING — MR MRI LUMBAR SPINE WITHOUT CONTRAST
7 of 9 series · 15 of 48 positions shown · IV contrast (gadolinium)
Comparison: None.

________________________________________________________________________________________________ 
MRI LUMBAR SPINE WITHOUT CONTRAST, 05/31/2023 [DATE]: 
CLINICAL INDICATION: Low back pain with bilateral leg pain
TECHNIQUE: Multiplanar, multiecho position MR images of the lumbar spine were 
performed without intravenous gadolinium enhancement. Patient was scanned on a 
1.5T magnet

[Series 101: survey · axial · 10.0mm · 1.25mm/px · 1 of 10 slices shown]
[im 1/10]
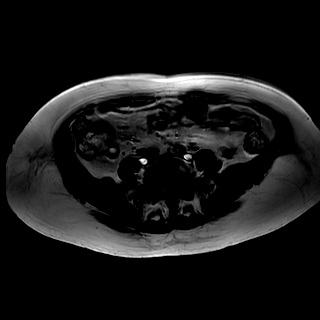

[Series 201: t2w_cor-surv · coronal · 6.0mm · 0.62mm/px · 2 of 14 slices shown]
[im 1/14]
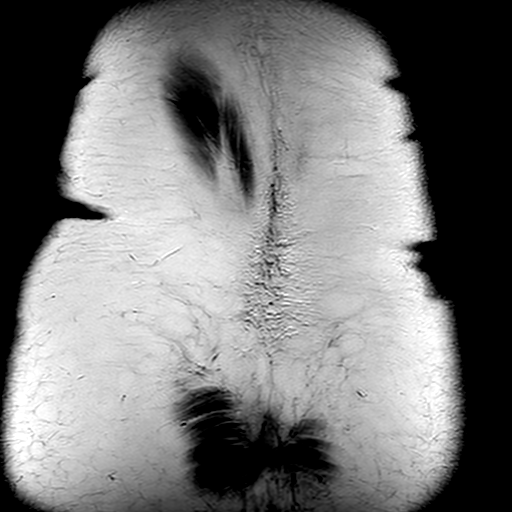
[im 14/14]
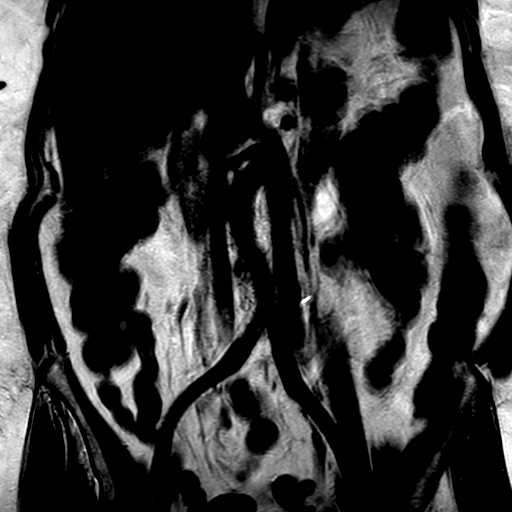

[Series 301: t1_tse_sag · sagittal · 4.0mm · 0.33mm/px · 2 of 19 slices shown]
[im 1/19]
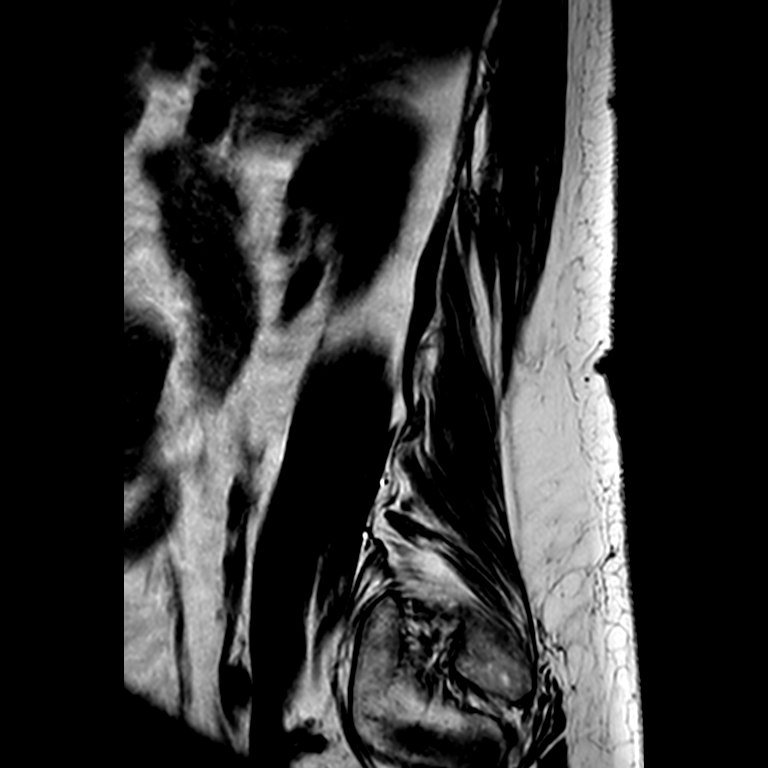
[im 19/19]
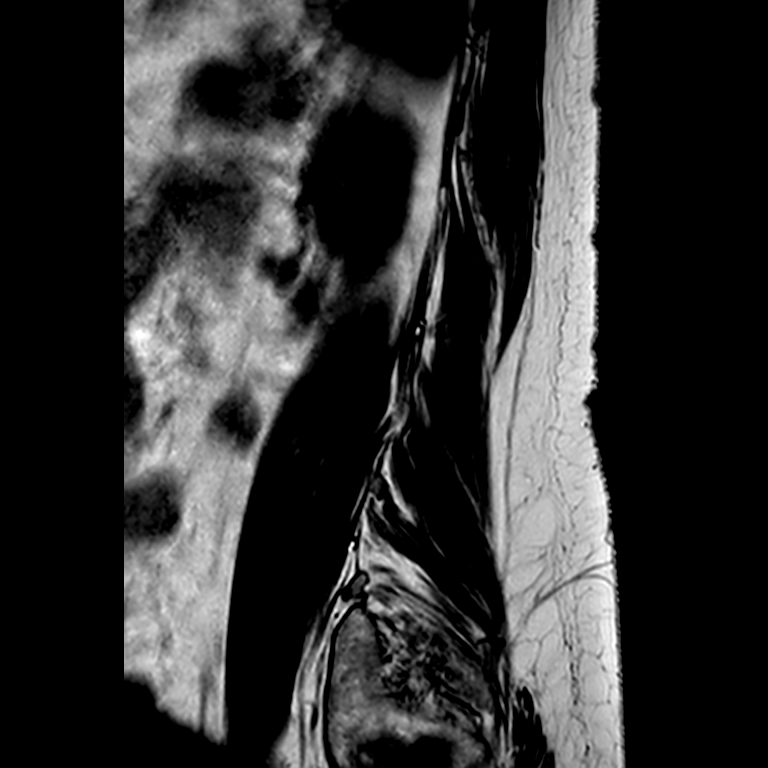

[Series 402: (id)_mdixon_tse · sagittal · 4.0mm · 0.48mm/px · 2 of 19 slices shown]
[im 1/19]
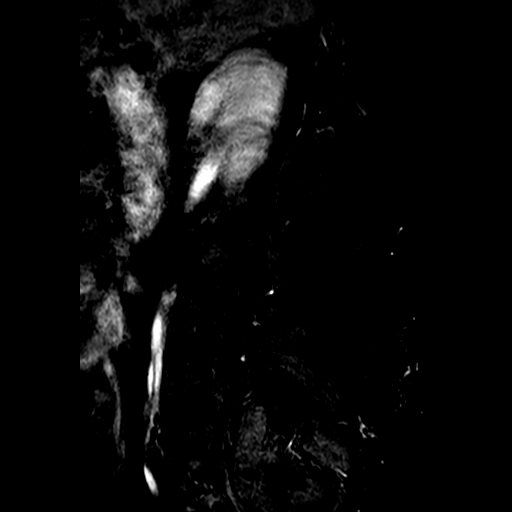
[im 19/19]
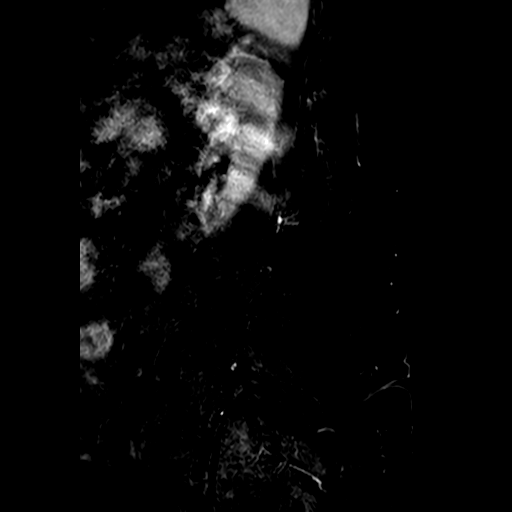

[Series 403: st2w_mdixon_tse · sagittal · 4.0mm · 0.48mm/px · 2 of 19 slices shown]
[im 1/19]
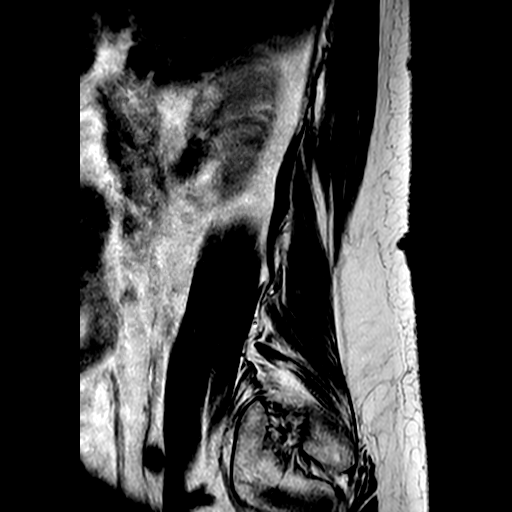
[im 19/19]
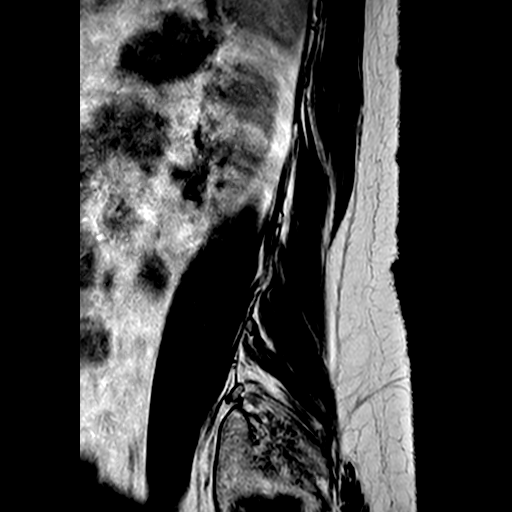

[Series 502: (id) view_ax mpr · axial · 1.0mm · 0.25mm/px · 1 of 135 slices shown]
[im 9/135]
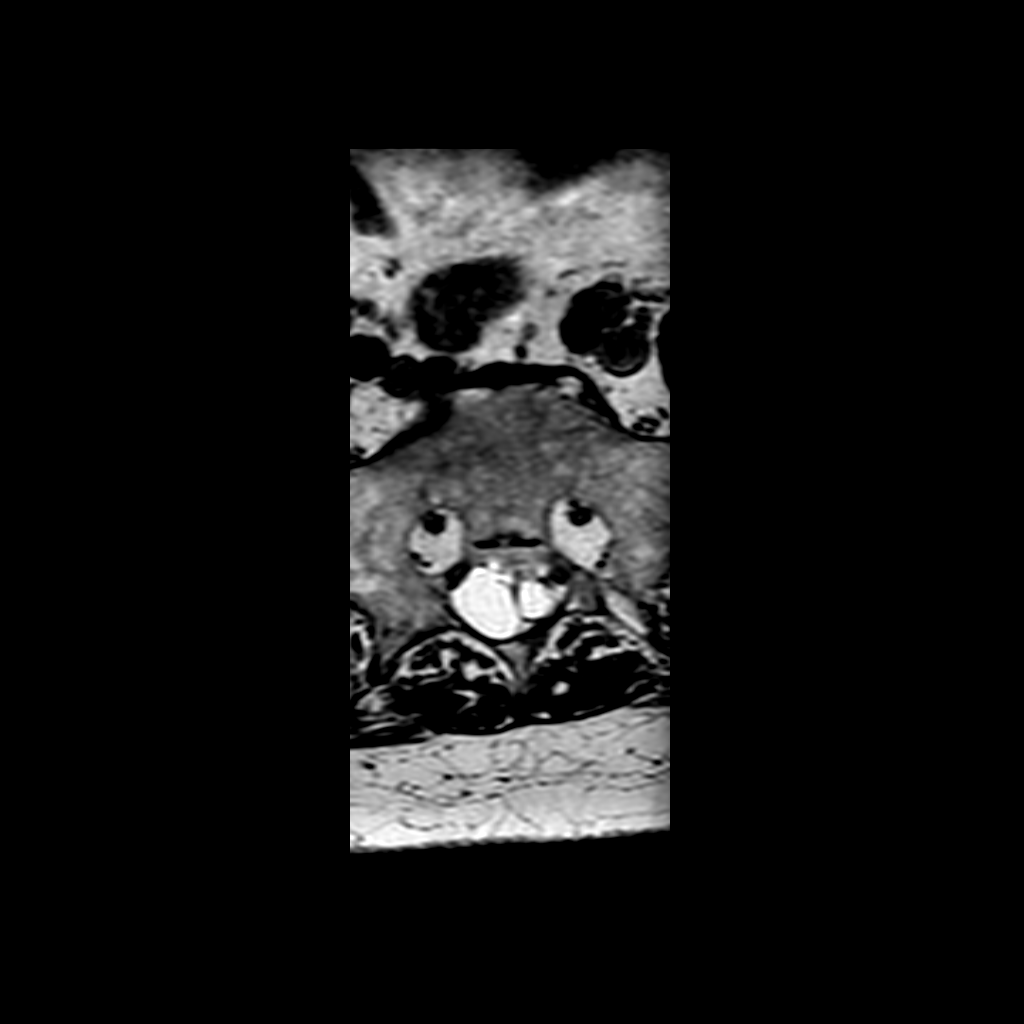

[Series 701: T1 · axial · 4.0mm · 0.39mm/px · z∈[-58,+140]mm · 5 of 45 slices shown]
[im 1/45]
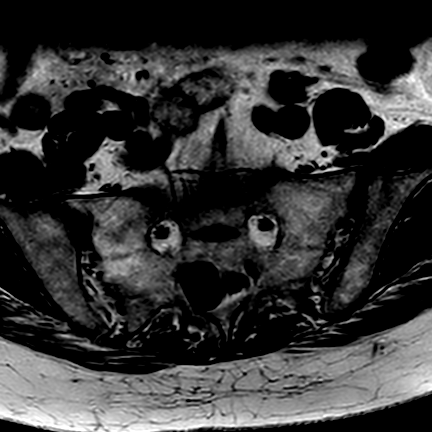
[im 12/45]
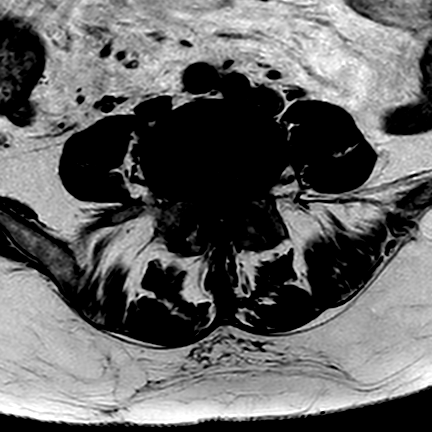
[im 23/45]
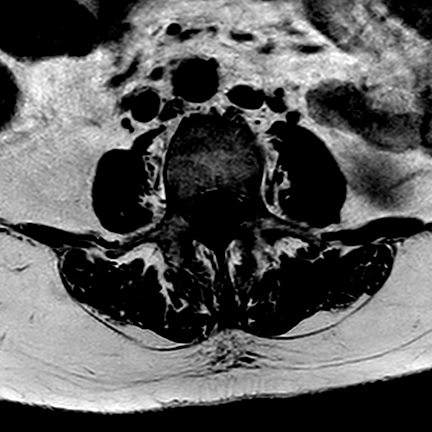
[im 34/45]
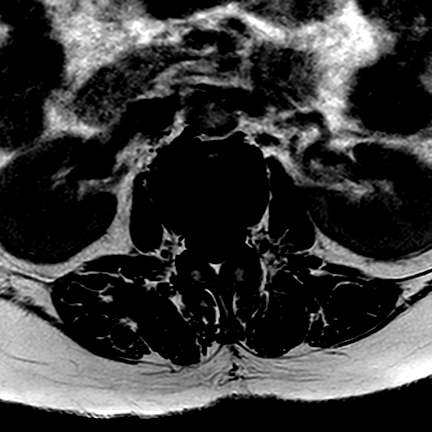
[im 45/45]
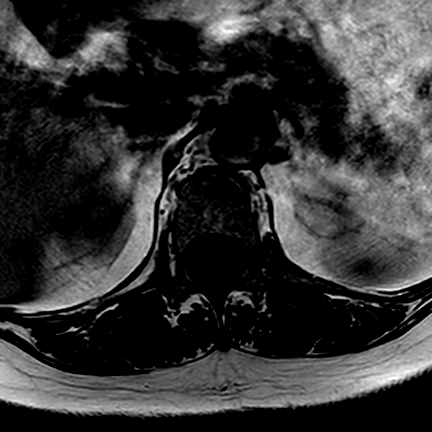

[15 of 48 positions shown; findings below may reference images not displayed]

FINDINGS: -------------------------------------------------------------------------------- 
------ 
GENERAL: 
Nomenclature is based on 5 lumbar type vertebral bodies.     
ALIGNMENT: Mild retrolisthesis of L2 on L3. 
VERTEBRAL BODY HEIGHT: Normal.  
MARROW SIGNAL: No focal suspect signal abnormality. 
CORD SIGNAL: Normal distal spinal cord and cauda equina.  Conus medullaris 
terminates at L1. 
ADDITIONAL FINDINGS: None. 
Modic I-II: Modic changes at all levels. 
Ligamentum Flavum > 2.5 mm: All levels. 
-------------------------------------------------------------------------------- 
------ 
SEGMENTAL: 
T12-L1: No significant central canal narrowing.  No significant right neural 
foraminal narrowing. No significant left neural foraminal narrowing.  
L1-L2: No significant central canal narrowing.  No significant right neural 
foraminal narrowing. No significant left neural foraminal narrowing.  
L2-L3: Disc bulge. No significant central canal narrowing. Moderate left greater 
than right subarticular recess narrowing.  Mild right neural foraminal 
narrowing. No significant left neural foraminal narrowing.  
L3-L4: Disc bulge. Bilateral facet and ligamentum flavum hypertrophy. No 
significant central canal narrowing. Moderate bilateral subarticular recess 
narrowing.  Mild right neural foraminal narrowing. Mild left neural foraminal 
narrowing.  
L4-L5: Disc bulge with central to right subarticular disc herniation. Bilateral 
facet and ligamentum flavum hypertrophy. Severe right-sided central canal 
narrowing with severe right subarticular recess narrowing. There is also 
moderate to severe left subarticular recess narrowing.  Moderate right neural 
foraminal narrowing. Mild left neural foraminal narrowing.  
L5-S1: Disc bulge. Bilateral facet hypertrophy. No significant central canal 
narrowing. Mild bilateral subarticular recess narrowing.  Mild right neural 
foraminal narrowing. Mild left neural foraminal narrowing.  
-------------------------------------------------------------------------------- 
------
IMPRESSION: 1.  Discogenic/degenerative changes as above. 
2.  Severe central canal/subarticular recess narrowing: L4-L5 
3.  Moderate neural foraminal narrowing: L4-L5 (right)
